# Patient Record
Sex: Male | Born: 1940 | Race: White | Hispanic: No | Smoking: Former smoker
Health system: Southern US, Community
[De-identification: ages and names within clinical notes are randomized; demographics above are authoritative.]

## PROBLEM LIST (undated history)

## (undated) DIAGNOSIS — I82409 Acute embolism and thrombosis of unspecified deep veins of unspecified lower extremity: Secondary | ICD-10-CM

## (undated) DIAGNOSIS — E669 Obesity, unspecified: Secondary | ICD-10-CM

## (undated) DIAGNOSIS — N200 Calculus of kidney: Secondary | ICD-10-CM

## (undated) DIAGNOSIS — E785 Hyperlipidemia, unspecified: Secondary | ICD-10-CM

## (undated) DIAGNOSIS — E559 Vitamin D deficiency, unspecified: Secondary | ICD-10-CM

## (undated) DIAGNOSIS — E1129 Type 2 diabetes mellitus with other diabetic kidney complication: Secondary | ICD-10-CM

## (undated) DIAGNOSIS — I509 Heart failure, unspecified: Secondary | ICD-10-CM

## (undated) DIAGNOSIS — M109 Gout, unspecified: Secondary | ICD-10-CM

## (undated) DIAGNOSIS — I10 Essential (primary) hypertension: Secondary | ICD-10-CM

## (undated) DIAGNOSIS — N400 Enlarged prostate without lower urinary tract symptoms: Secondary | ICD-10-CM

## (undated) HISTORY — DX: Heart failure, unspecified: I50.9

## (undated) HISTORY — DX: Benign prostatic hyperplasia without lower urinary tract symptoms: N40.0

## (undated) HISTORY — DX: Type 2 diabetes mellitus with other diabetic kidney complication: E11.29

## (undated) HISTORY — PX: EYE SURGERY: SHX253

## (undated) HISTORY — DX: Hyperlipidemia, unspecified: E78.5

## (undated) HISTORY — DX: Acute embolism and thrombosis of unspecified deep veins of unspecified lower extremity: I82.409

## (undated) HISTORY — DX: Gout, unspecified: M10.9

## (undated) HISTORY — DX: Calculus of kidney: N20.0

## (undated) HISTORY — DX: Vitamin D deficiency, unspecified: E55.9

## (undated) HISTORY — DX: Obesity, unspecified: E66.9

## (undated) HISTORY — DX: Essential (primary) hypertension: I10

---

## 1997-04-27 HISTORY — PX: CARDIAC CATHETERIZATION: SHX172

## 2001-09-21 ENCOUNTER — Inpatient Hospital Stay (HOSPITAL_COMMUNITY): Admission: RE | Admit: 2001-09-21 | Discharge: 2001-09-22 | Payer: Self-pay | Admitting: Nurse Practitioner

## 2001-09-21 ENCOUNTER — Encounter: Payer: Self-pay | Admitting: Family Medicine

## 2001-09-29 ENCOUNTER — Encounter: Admission: RE | Admit: 2001-09-29 | Discharge: 2001-09-29 | Payer: Self-pay | Admitting: Family Medicine

## 2004-03-01 ENCOUNTER — Emergency Department (HOSPITAL_COMMUNITY): Admission: EM | Admit: 2004-03-01 | Discharge: 2004-03-01 | Payer: Self-pay | Admitting: Emergency Medicine

## 2004-04-11 ENCOUNTER — Ambulatory Visit: Payer: Self-pay | Admitting: Ophthalmology

## 2004-04-15 ENCOUNTER — Ambulatory Visit: Payer: Self-pay | Admitting: Ophthalmology

## 2004-05-27 ENCOUNTER — Ambulatory Visit: Payer: Self-pay | Admitting: Ophthalmology

## 2004-06-03 ENCOUNTER — Ambulatory Visit: Payer: Self-pay | Admitting: Ophthalmology

## 2005-04-27 HISTORY — PX: OTHER SURGICAL HISTORY: SHX169

## 2005-10-06 ENCOUNTER — Ambulatory Visit (HOSPITAL_COMMUNITY): Admission: RE | Admit: 2005-10-06 | Discharge: 2005-10-06 | Payer: Self-pay | Admitting: Orthopaedic Surgery

## 2006-04-27 HISTORY — PX: URETERAL STENT PLACEMENT: SHX822

## 2007-03-12 ENCOUNTER — Inpatient Hospital Stay (HOSPITAL_COMMUNITY): Admission: EM | Admit: 2007-03-12 | Discharge: 2007-03-17 | Payer: Self-pay | Admitting: Emergency Medicine

## 2007-03-12 ENCOUNTER — Ambulatory Visit: Payer: Self-pay | Admitting: *Deleted

## 2007-03-14 ENCOUNTER — Encounter (INDEPENDENT_AMBULATORY_CARE_PROVIDER_SITE_OTHER): Payer: Self-pay | Admitting: Interventional Cardiology

## 2007-03-14 ENCOUNTER — Ambulatory Visit: Payer: Self-pay | Admitting: *Deleted

## 2007-04-04 ENCOUNTER — Ambulatory Visit (HOSPITAL_COMMUNITY): Admission: RE | Admit: 2007-04-04 | Discharge: 2007-04-04 | Payer: Self-pay | Admitting: Urology

## 2007-08-22 ENCOUNTER — Encounter: Payer: Self-pay | Admitting: Emergency Medicine

## 2007-08-22 ENCOUNTER — Ambulatory Visit: Payer: Self-pay | Admitting: *Deleted

## 2007-08-23 ENCOUNTER — Inpatient Hospital Stay (HOSPITAL_COMMUNITY): Admission: EM | Admit: 2007-08-23 | Discharge: 2007-08-26 | Payer: Self-pay | Admitting: *Deleted

## 2007-08-23 ENCOUNTER — Encounter (INDEPENDENT_AMBULATORY_CARE_PROVIDER_SITE_OTHER): Payer: Self-pay | Admitting: *Deleted

## 2007-12-08 ENCOUNTER — Emergency Department (HOSPITAL_COMMUNITY): Admission: EM | Admit: 2007-12-08 | Discharge: 2007-12-08 | Payer: Self-pay | Admitting: Emergency Medicine

## 2007-12-28 ENCOUNTER — Inpatient Hospital Stay (HOSPITAL_COMMUNITY): Admission: EM | Admit: 2007-12-28 | Discharge: 2007-12-29 | Payer: Self-pay | Admitting: Emergency Medicine

## 2007-12-29 ENCOUNTER — Ambulatory Visit: Payer: Self-pay | Admitting: Vascular Surgery

## 2007-12-29 ENCOUNTER — Encounter (INDEPENDENT_AMBULATORY_CARE_PROVIDER_SITE_OTHER): Payer: Self-pay | Admitting: Internal Medicine
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
## 2008-10-23 IMAGING — US US RENAL PORT
1 series · 14 of 25 positions shown · non-contrast
Comparison: No priors for comparison.

CLINICAL DATA: Elevated creatinine and myocardial infarction.  
 PORTABLE RENAL/URINARY TRACT ULTRASOUND:
TECHNIQUE: Complete ultrasound examination of the urinary tract was performed including evaluation of the kidneys, renal collecting systems, and urinary bladder.

[Series 1: unknown · 0.39mm/px · 14 of 32 slices shown]
[im 1/32]
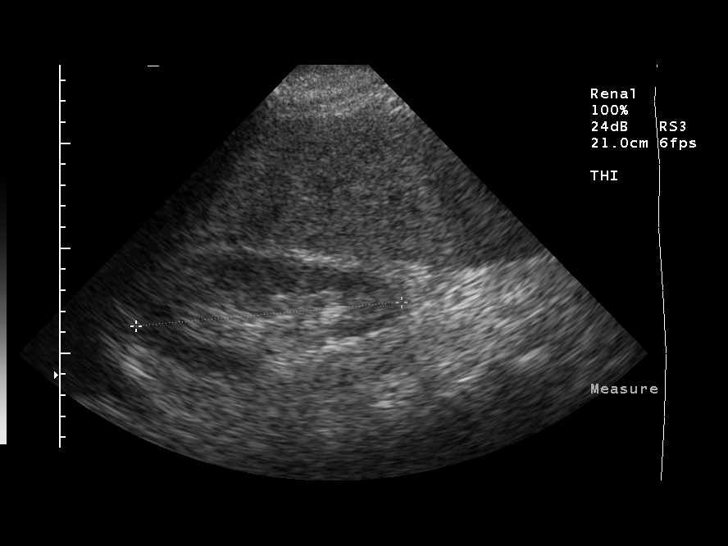
[im 3/32]
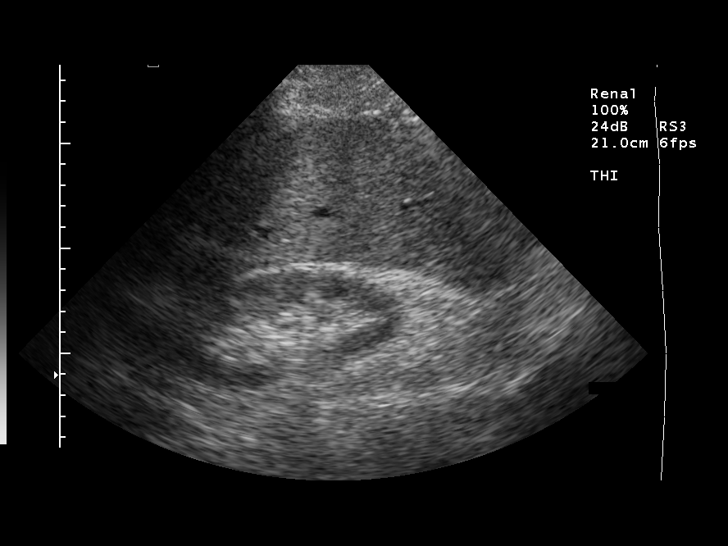
[im 6/32]
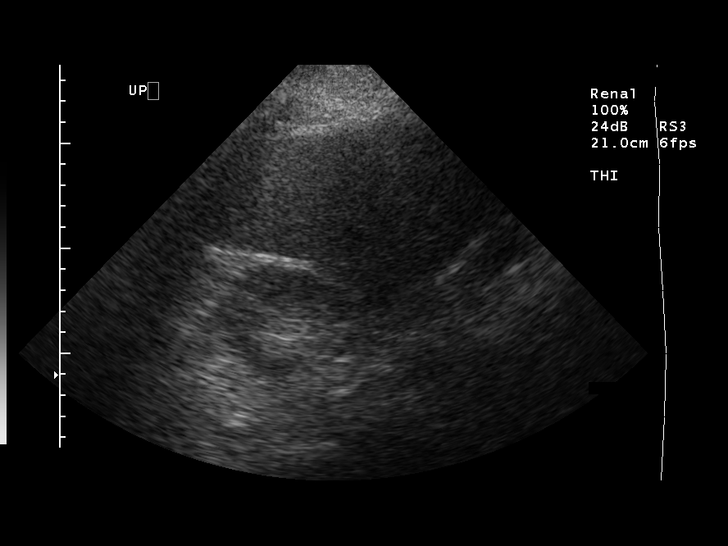
[im 8/32]
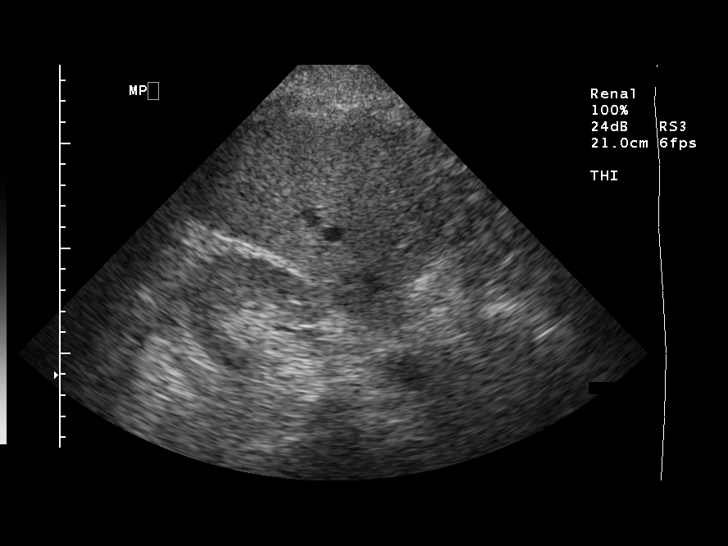
[im 11/32]
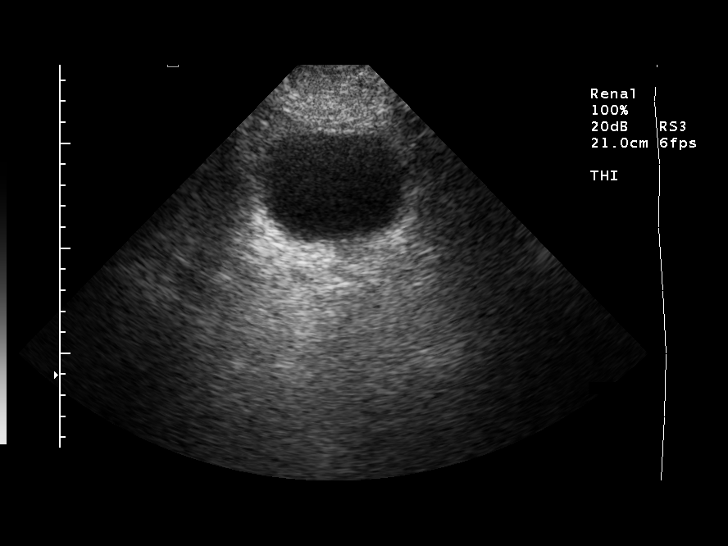
[im 12/32]
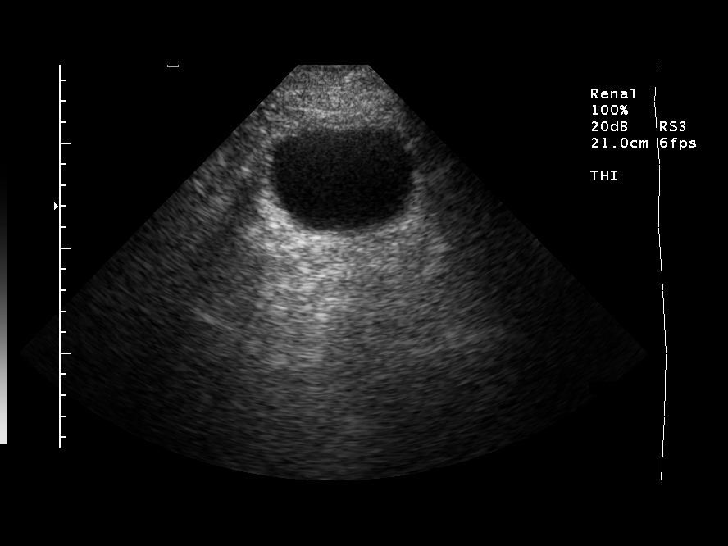
[im 15/32]
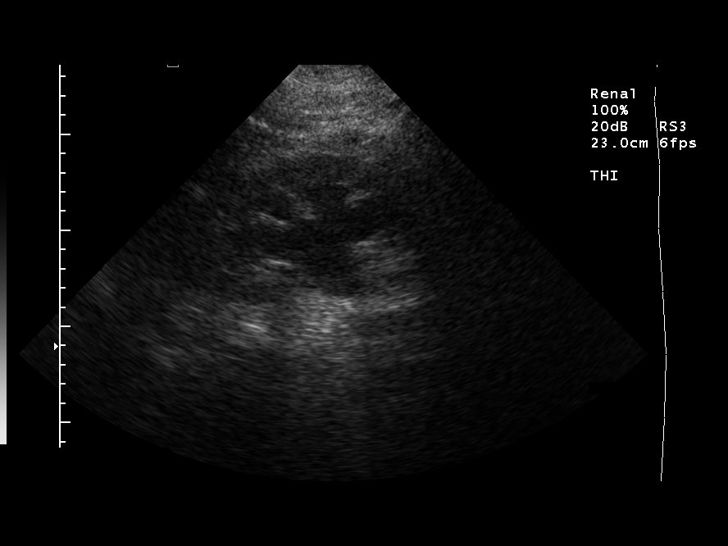
[im 17/32]
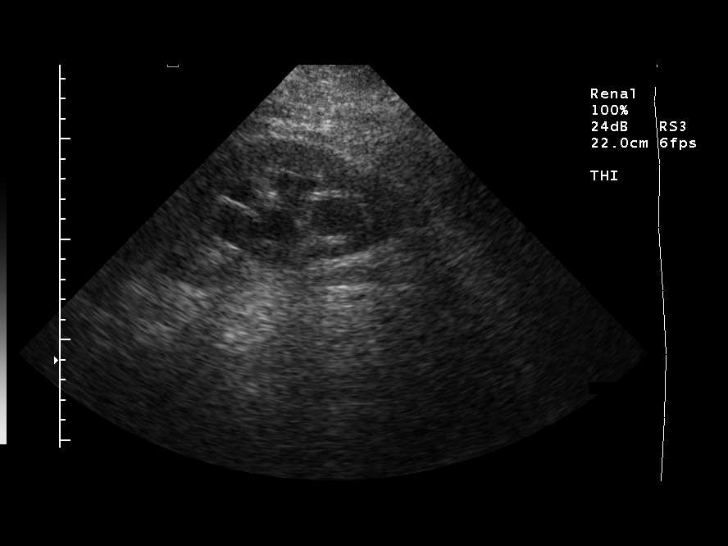
[im 20/32]
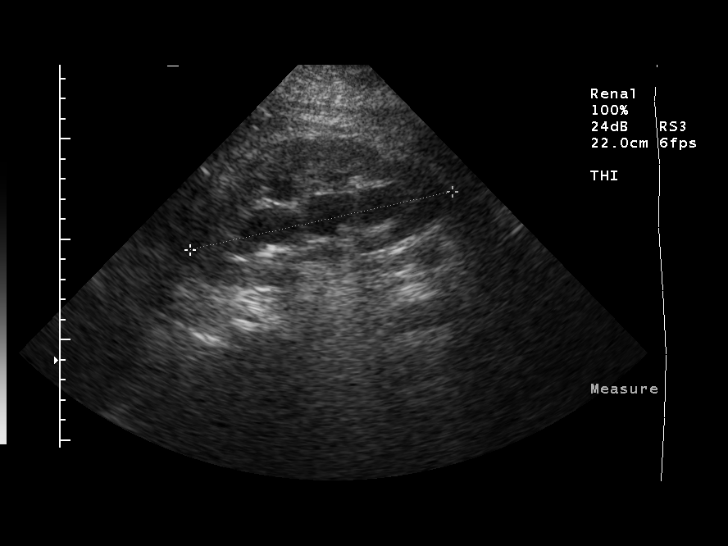
[im 21/32]
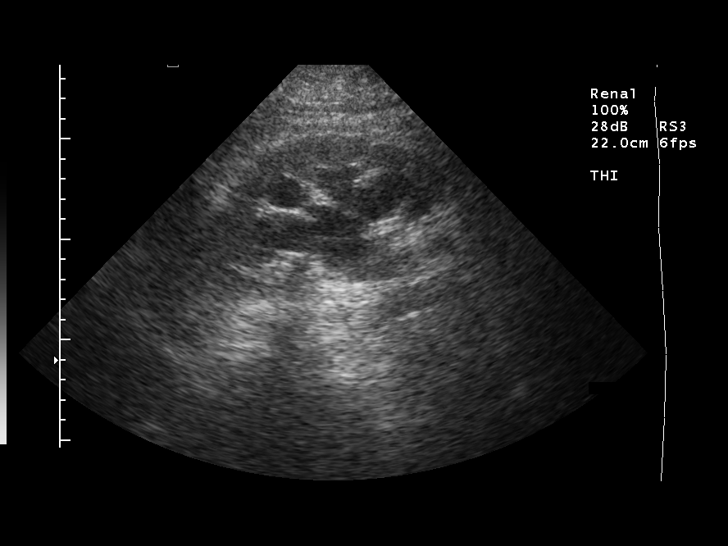
[im 24/32]
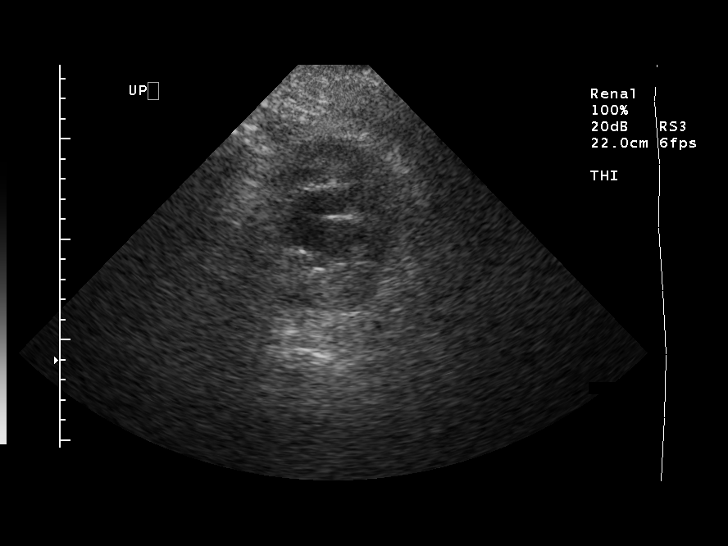
[im 26/32]
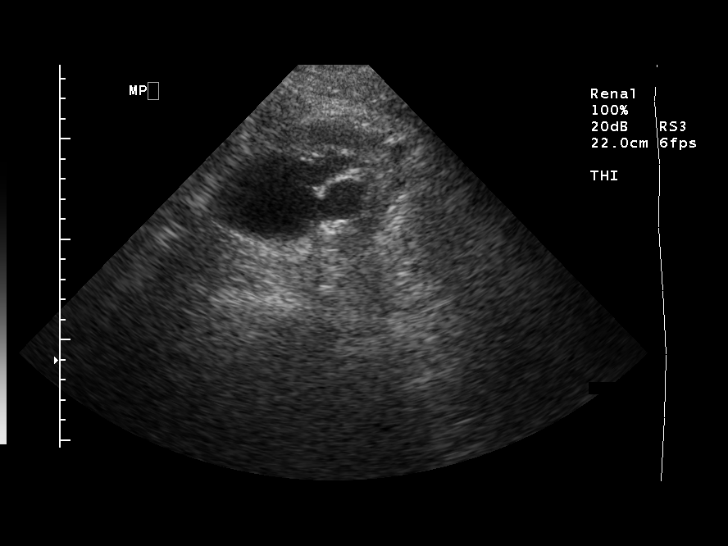
[im 29/32]
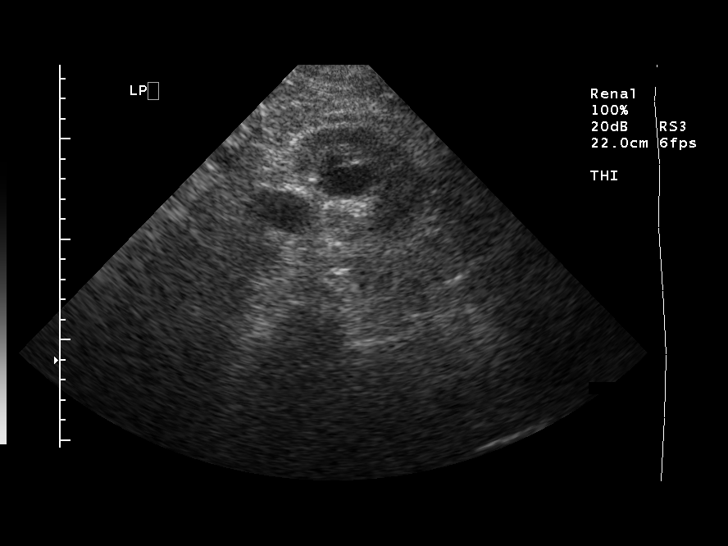
[im 32/32]
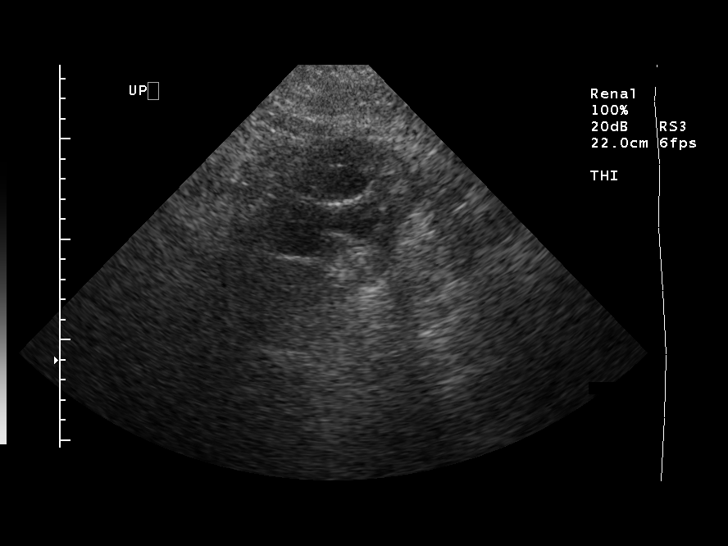

[14 of 25 positions shown; findings below may reference images not displayed]

FINDINGS: The right kidney is 12.7 cm in length and the left kidney is 13.4 cm in length.  There is moderate hydronephrosis of the left kidney.  The visualized proximal portion of the left ureter appears dilated; however, only a small segment of the ureter can be visualized sonographically. 
 The renal cortex of the left kidney appears preserved. 
 There is no hydronephrosis on the right.
 Imaging of the bladder shows it to be moderately distended with urine.
IMPRESSION: Moderate left      hydronephrosis.  A discrete cause      for the hydronephrosis is not identified sonographically. 
  Negative for hydronephrosis      on the right.  

 This study was made a call report.

## 2008-11-15 IMAGING — CR DG ABDOMEN 1V
2 series · 2 of 2 positions shown · non-contrast
Comparison: No prior plain films.  This exam is correlated with a CT scan dated 03/15/07.

CLINICAL DATA: Pre-ESWL.
 ABDOMEN ? 1 VIEW:

[t abdomen supine (1 of 2)]
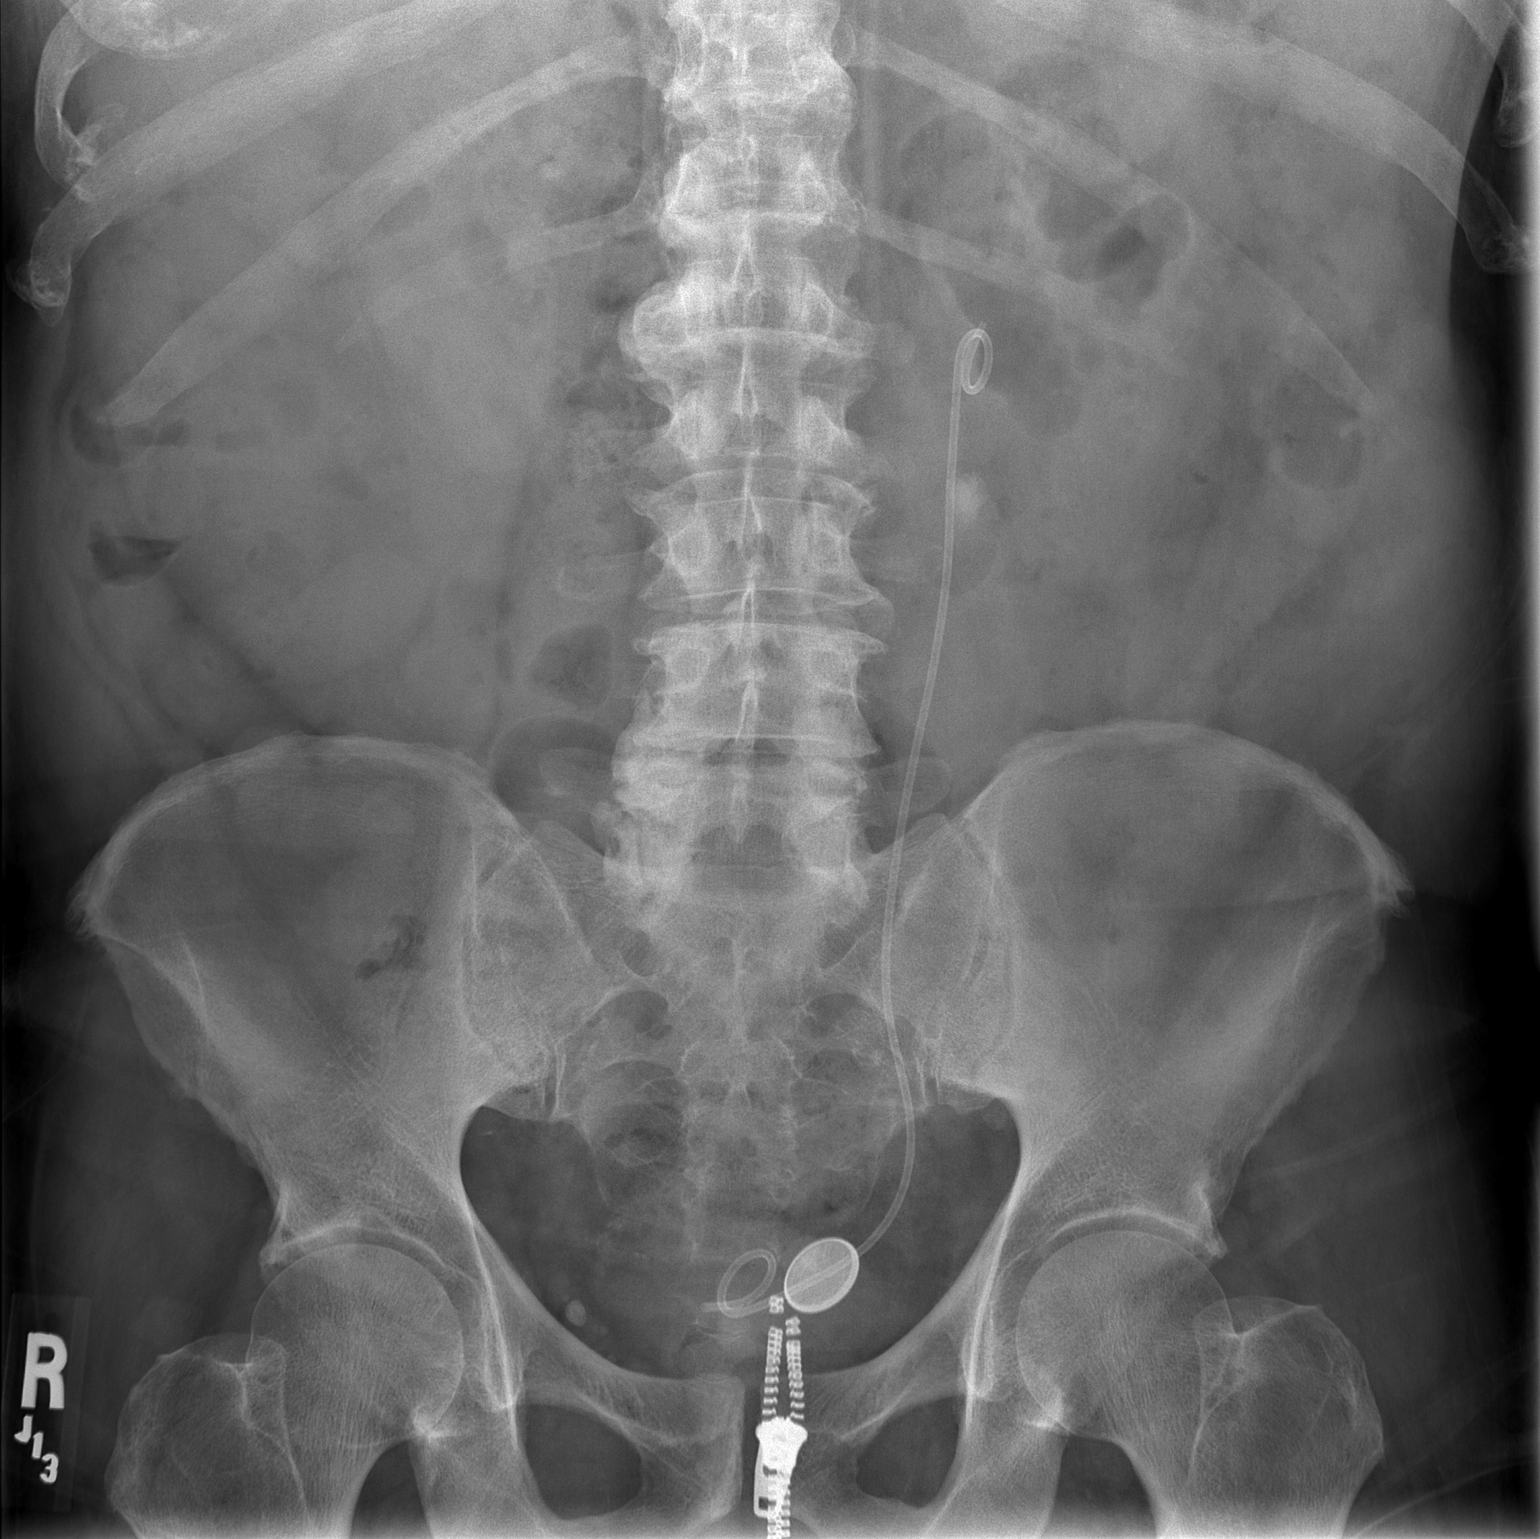

[t abdomen supine (2 of 2)]
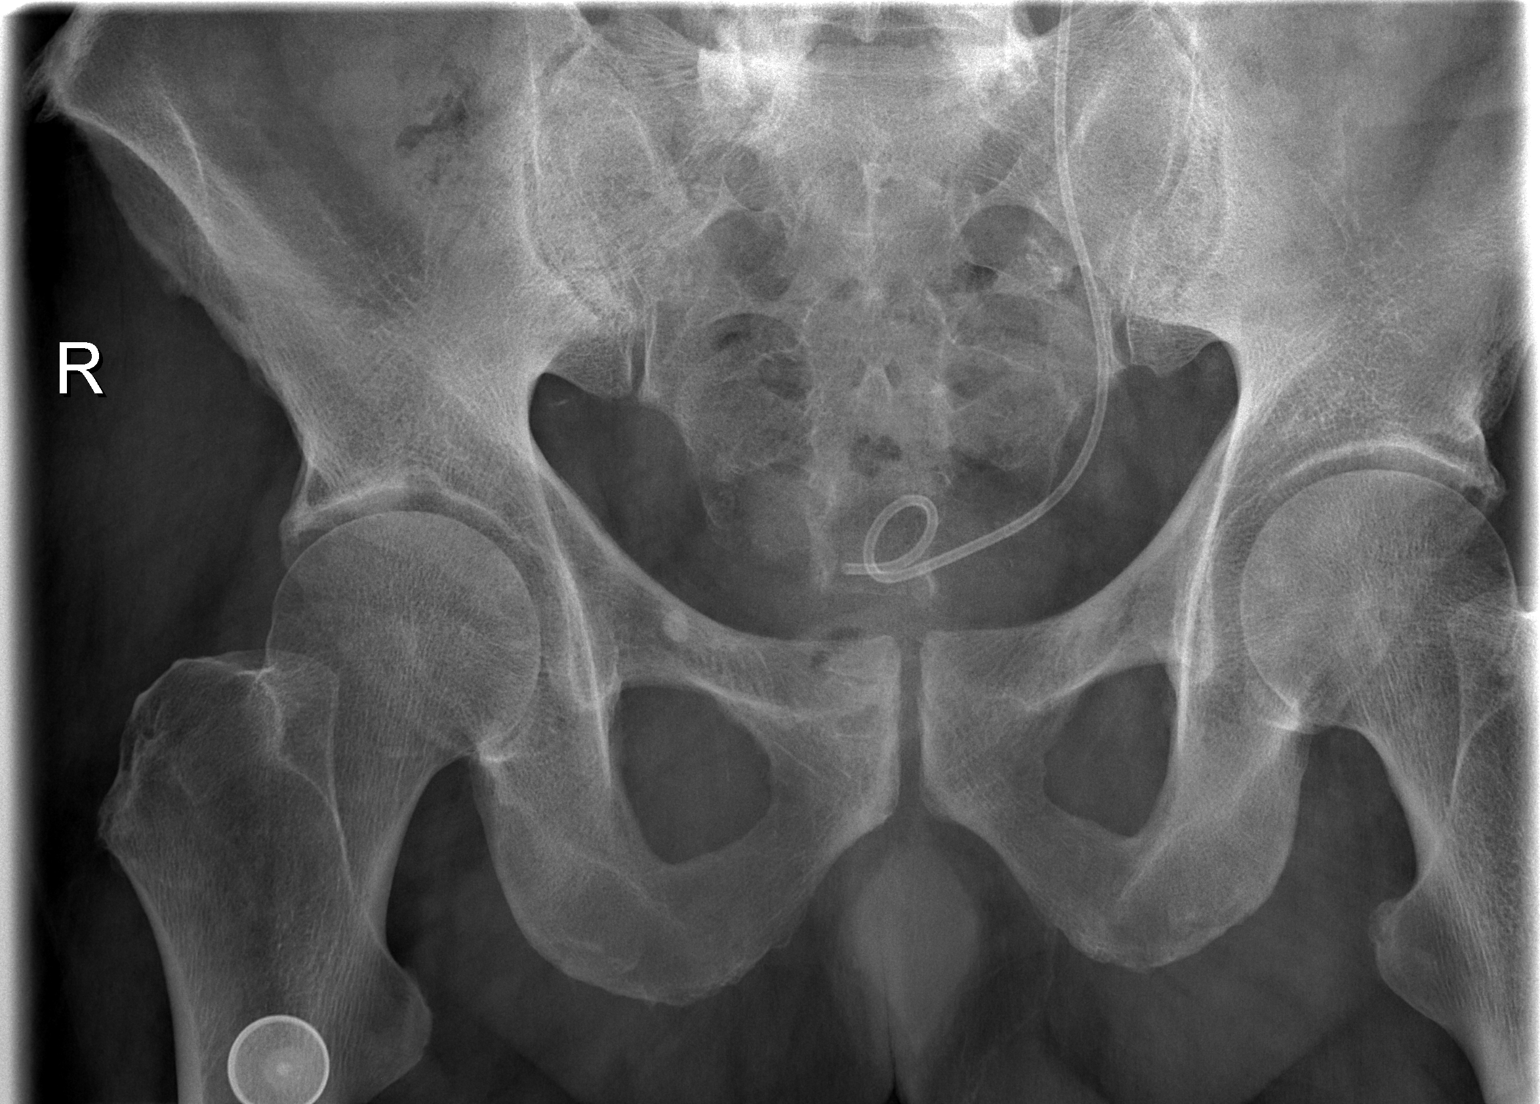

[2 of 2 positions shown; findings below may reference images not displayed]

FINDINGS: A Double-J ureteral catheter has been placed on the left.  The large ureteral stone, which was at the level of L4 on the CT scan scout view, has now been pushed upward into the ureter at the level of about L2-3.  No definite renal calculi.
IMPRESSION: 1.  A Double-J ureteral catheter has been placed on the left.
 2.  The large left ureteral stone is a bit more proximal in position when compared to the CT scan dated 03/15/07.

## 2009-08-10 IMAGING — CT CT CERVICAL SPINE W/O CM
3 of 4 series · 16 of 33 positions shown, 19 images · non-contrast
Comparison: Brain MRI 08/24/2007.

CT HEAD

CLINICAL DATA: THE PATIENT STATUS POST FALL.

CT HEAD WITHOUT CONTRAST
CT CERVICAL SPINE WITHOUT CONTRAST
TECHNIQUE: Multidetector CT imaging of the head and cervical spine
was performed following the standard protocol without intravenous
contrast.  Multiplanar CT image reconstructions of the cervical
spine were also generated.

[Series 6: c_spine 2.0 b31s detail · axial · 0.24mm/px · z∈[+309,+441]mm · 8 of 86 slices shown, 10 images]
[im 10/86  soft-tissue]
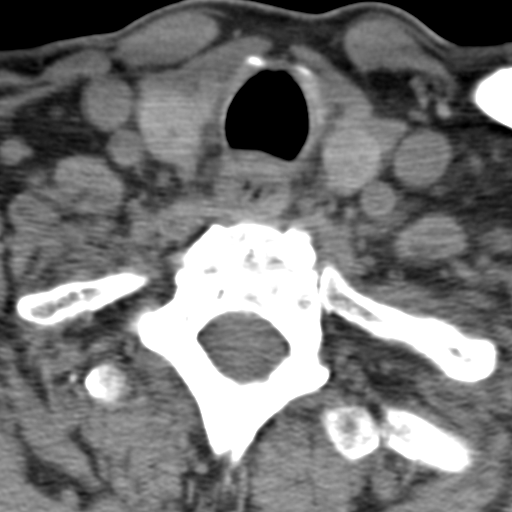
[im 10/86  bone]
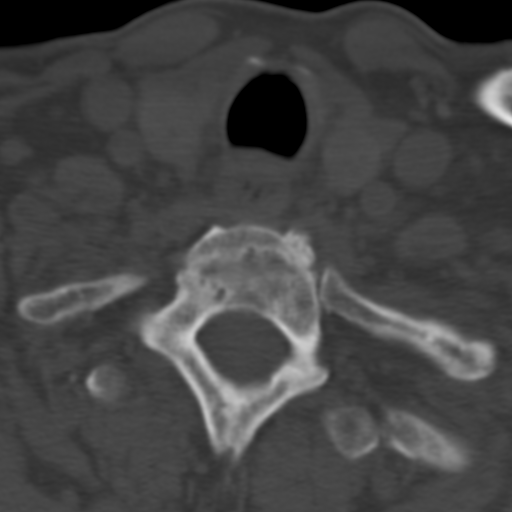
[im 19/86  bone]
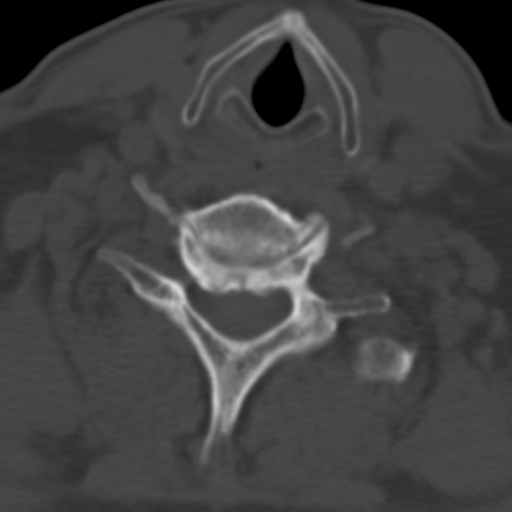
[im 29/86  bone]
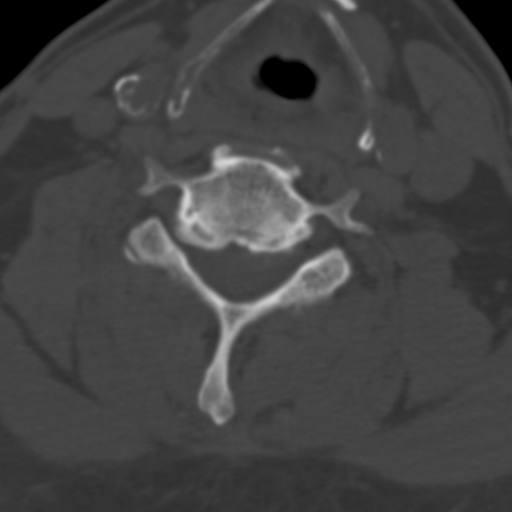
[im 38/86  bone]
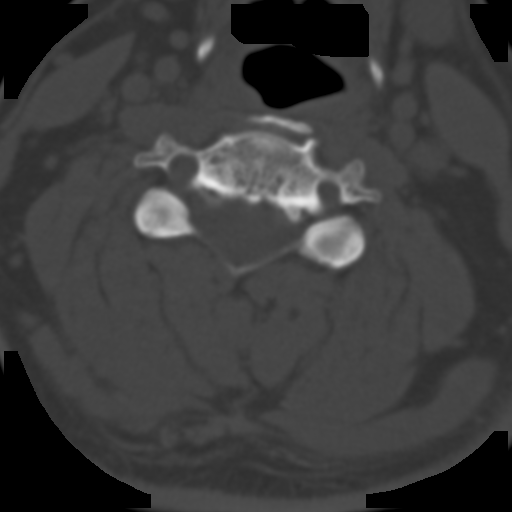
[im 48/86  soft-tissue]
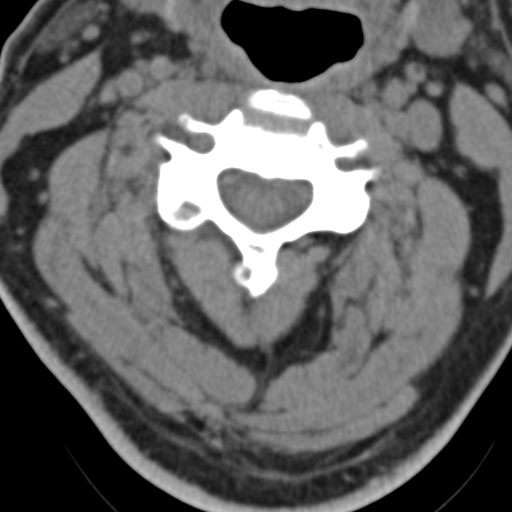
[im 48/86  bone]
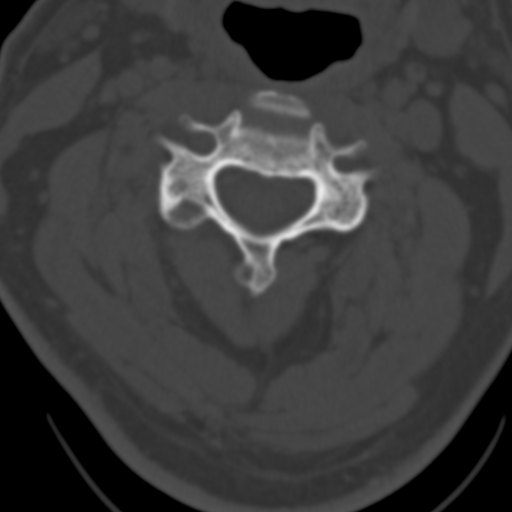
[im 57/86  bone]
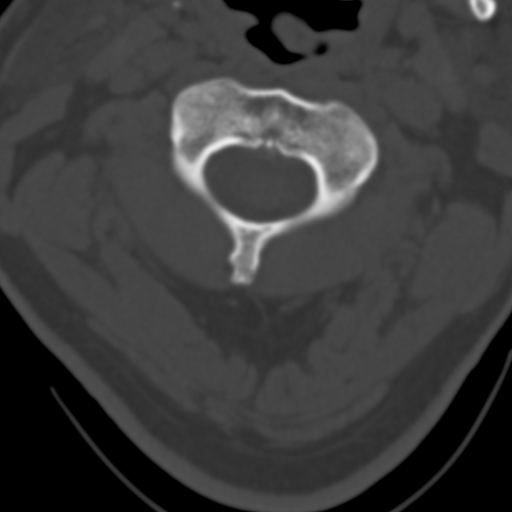
[im 67/86  bone]
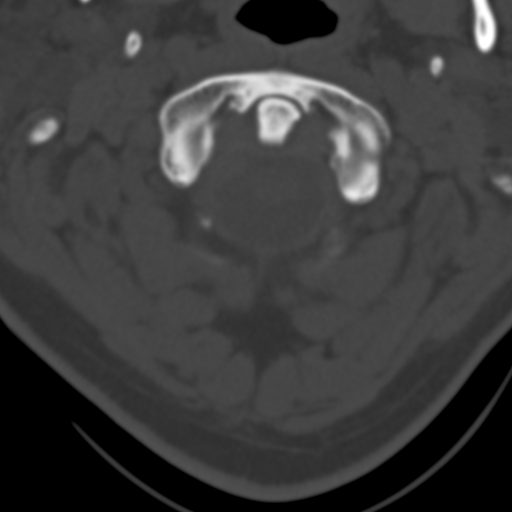
[im 76/86  bone]
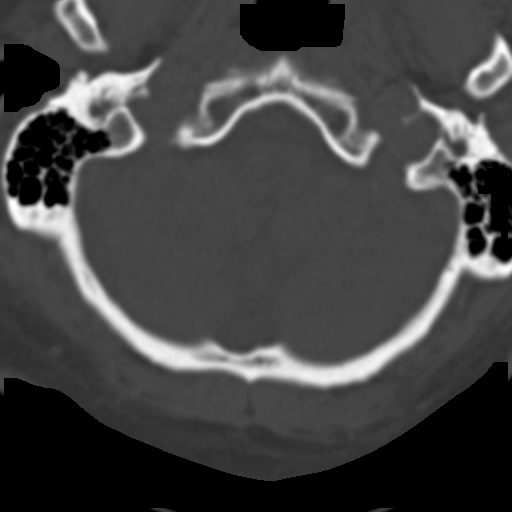

[Series 11: c_spine 2.0 spo cor detail · coronal · 0.43mm/px · 3 of 46 slices shown]
[im 10/46  bone]
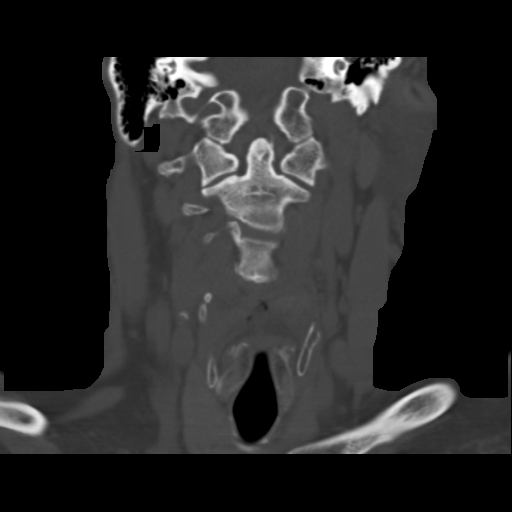
[im 19/46  bone]
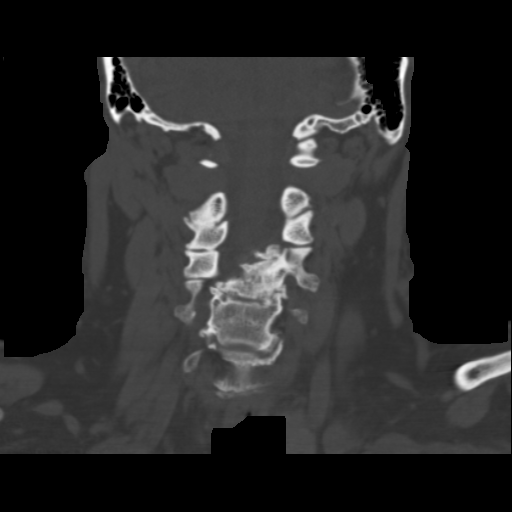
[im 28/46  bone]
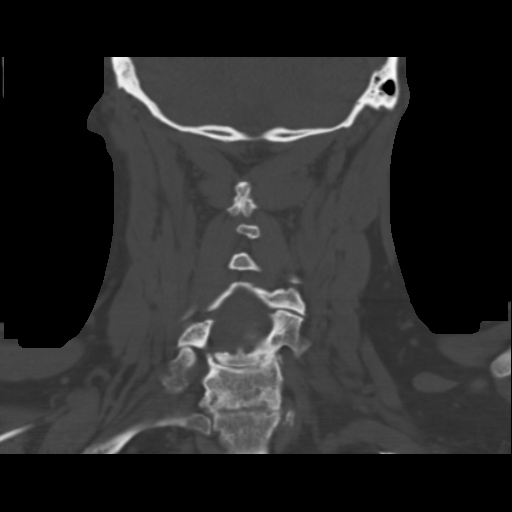

[Series 12: c_spine 2.0 spo sag detail · sagittal · 0.43mm/px · 5 of 51 slices shown, 6 images]
[im 17/51  bone]
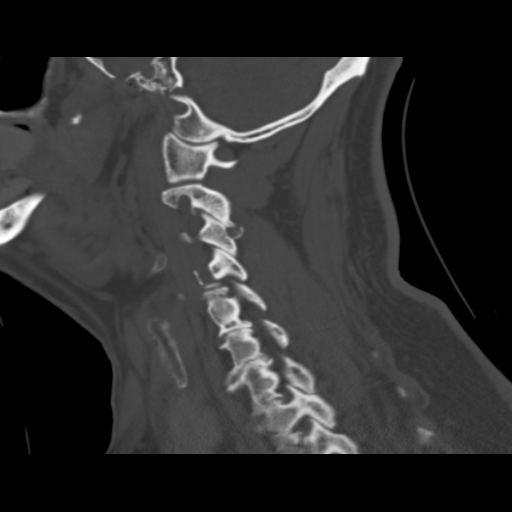
[im 21/51  bone]
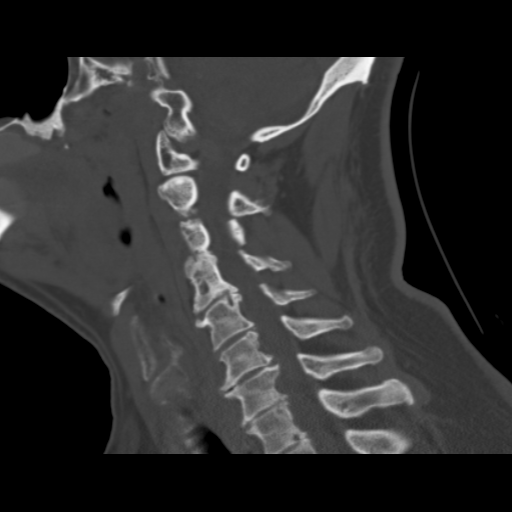
[im 26/51  soft-tissue]
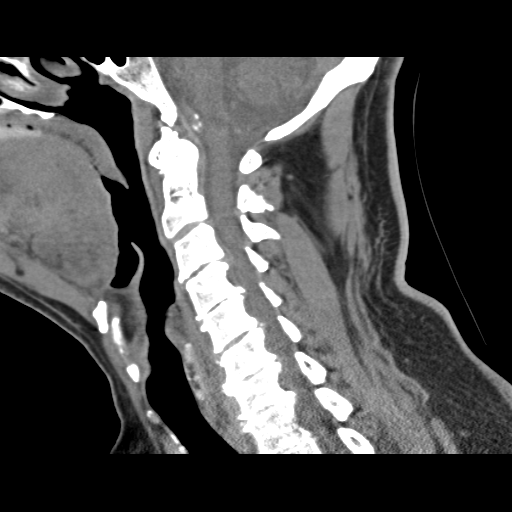
[im 26/51  bone]
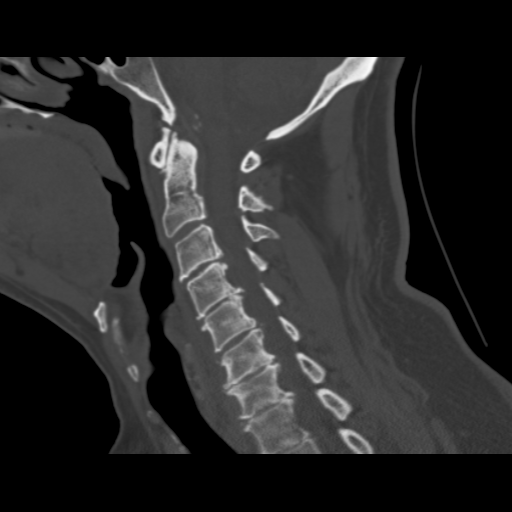
[im 30/51  bone]
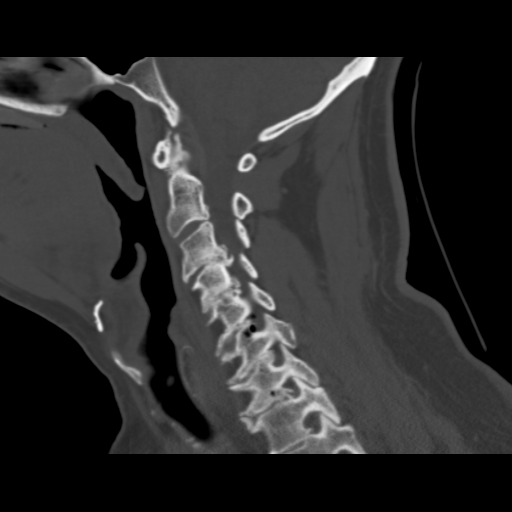
[im 34/51  bone]
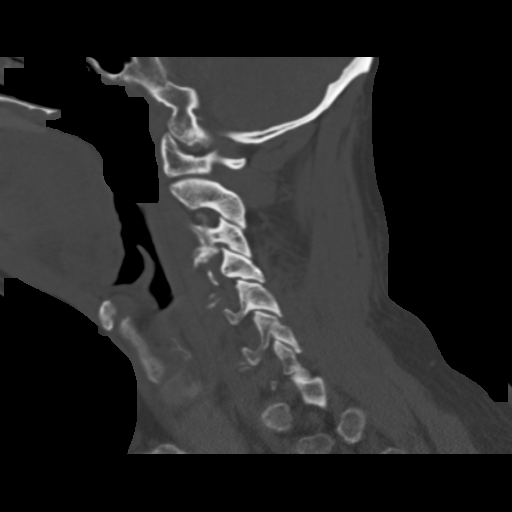

[16 of 33 positions shown; findings below may reference images not displayed]

FINDINGS: There is soft tissue contusion over the right supra
orbital rim.  Orbit as visualized on this head CT appears normal.
No underlying fracture is identified.  The brain appears normal
without evidence of hemorrhage, infarct, mass, mass effect, midline
shift or abnormal extra-axial fluid collection.  No hydrocephalus.
IMPRESSION: No acute intracranial abnormality in patient with a soft tissue
contusion over the right supra orbital rim.

CT CERVICAL SPINE
FINDINGS: Vertebral body height and alignment maintained.
Prevertebral soft tissues appear normal.  No epidural hematoma is
identified.  The patient has multilevel degenerative disease of the
cervical spine with endplate spurring at all levels and loss of
disc space height at multiple levels noted.  Lung apices are clear.
IMPRESSION: 1.  No acute finding
2.  Degenerative disease.

## 2009-12-12 ENCOUNTER — Ambulatory Visit (HOSPITAL_COMMUNITY): Admission: RE | Admit: 2009-12-12 | Discharge: 2009-12-12 | Payer: Self-pay | Admitting: Internal Medicine

## 2010-09-09 NOTE — Consult Note (Signed)
NAMEKHARI, [name]                ACCOUNT NO.:  [id number]   MEDICAL RECORD NO.:  [id number]          PATIENT TYPE:  INP   LOCATION:  3703                         FACILITY:  [facility]   PHYSICIAN:  Maree Krabbe, M.D.DATE OF BIRTH:  [birth year]   DATE OF CONSULTATION:  03/12/2007  DATE OF DISCHARGE:                                 CONSULTATION   REASON FOR CONSULTATION:  Elevated creatinine.   HISTORY OF PRESENT ILLNESS:  The patient is a 70 year old white male  with a history of DVT, hypertension and diabetes.  He has had  hypertension and diabetes for about 15 years.  He has had chronic ankle  edema in the left leg since his DVT in 2003.  He has also had some lower  extremity swelling for the last couple of years, but no history of heart  failure or pulmonary edema in the past.  He also has no history of renal  insufficiency.  The patient is smoker.  He denies any history of  diabetic retinopathy.  He has history of kidney stones, osteoarthritis  and lipoma removal.   The patient was admitted earlier today with a 2-week history of  worsening lower extremity and progressive dyspnea on exertion.  He had  some associated chest heaviness.  He says he has had orthopnea for  years.  He was found to have pulmonary edema.  Blood pressure was high  in the emergency room at 210/106 initially.  Further cardiac studies are  pending.  Cardiac enzymes were negative.  He has been treated with IV  Lasix and was started on captopril initially, but that has been stopped  given his elevated creatinine.  He is on IV heparin and has had no  further chest pain.  He is breathing is better currently with a  reasonable urine output.   PAST MEDICAL HISTORY:  Past medical history as above.   MEDICATIONS:  On admission, he was taking Lantus, aspirin and one other  medication, which I cannot make out on the H&P.   CURRENT MEDICATIONS:  Aspirin, Lipitor, Colace, IV Lasix, IV heparin,  insulin, Bidil one  t.i.d., Lopressor p.r.n.   SOCIAL HISTORY:  A 70 year old, smoker, widowed 4 years ago.  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   FAMILY HISTORY:  Positive for CHF, diabetes, Parkinson's in his father  and his mother has heart disease.  No renal failure.   REVIEW OF SYSTEMS:  GENERAL:  Denies fever, chills, sweats or weight  loss.  ENT:  No hearing loss or visual change.  No difficulty  swallowing.  CARDIORESPIRATORY:  As above.  GI:  No nausea, vomiting,  abdominal pain or diarrhea.  GU:  No difficulty voiding.  MUSCULOSKELETAL:  No myalgia or arthralgia, otherwise as above.  NEUROLOGIC:  No focal numbness or weakness.  No history of stroke, TIA  or seizure.   PHYSICAL EXAMINATION:  VITAL SIGNS:  Temperature 98.3, blood pressure  160/85, heart rate is 80, respirations are 16 and unlabored.  GENERAL:  This is an alert, pleasant adult male in no acute distress.  SKIN:  Without rash.  HEENT:  PERRL, EOMI.  Throat is clear.  NECK:  Supple with nondistended neck veins.  CHEST:  Faint rales at the left base.  The right is clear.  CARDIAC:  Regular rate and rhythm without murmur, rub or gallop.  ABDOMEN:  Soft, nontender, no masses or bruits.  GENITALIA:  Deferred.  EXTREMITIES:  He has 3+ pitting edema from the knees to the ankles  bilaterally. He has skin discoloration in both lower legs consistent  with chronic venous insufficiency.  It is worse on left than on the  right.  He has palpable dorsalis pedis pulse bilaterally which is 1+.  He has no femoral bruits.  No carotid bruits.  NEUROLOGIC:  Alert and oriented x3, nonfocal motor exam.   LABORATORY DATA AND X-RAY FINDINGS:  Hemoglobin A1c was 5.5.  CBC with  hemoglobin 11, white count, normal platelets normal.  Cholesterol was  239, LDL fraction was 168.  BUN was 23, creatinine 2.14.  Estimated GFR  was 31.  Potassium 3.5, bicarb 29.  BMP was elevated at 1061.  Urinalysis with no rbc's wbc's.  He had a 100 protein.   Chest  x-ray with bilateral CHF and small effusions.   IMPRESSION/RECOMMENDATIONS:  1. Chronic kidney disease, stage III-IV, uncertain cause.  He may have      hypertensive nephrosclerosis, diabetic nephropathy or some other      renal disease.  Need to rule out obstruction with a renal      ultrasound and also screen for multiple myeloma with a 24-urine and      serum protein electrophoresis.  Last creatinine was 1.1 in 2005.      We have no other recent data here in the hospital.  Agree with the      current medical treatment including holding angiotensin-converting      enzyme inhibitors, diuresis and blood pressure control.  His blood      sugar control appears to have been very good with a low hemoglobin      A1c.  2. Volume excess.  3. New diagnosis of congestive heart failure with associated pulmonary      edema workup in progress.  4. Chronic venous insufficiency of both lower extremities.  5. History of left lower extremity deep venous thrombosis in 2003.  6. Acute coronary syndrome.  7. Hypertensive urgency.  8. Diabetes mellitus type 2, non-insulin-dependent with 15 years      duration.  9. Tobacco use.      Maree Krabbe, M.D.  Electronically Signed     RDS/MEDQ  D:  03/12/2007  T:  03/14/2007  Job:  [id number]

## 2010-09-09 NOTE — Procedures (Signed)
EEG NUMBER:  [id number].   REQUESTING PHYSICIAN:  Vania Rea, MD   CLINICAL HISTORY:  This is a portable EEG done at bedside.  The patient  is described as awake and asleep.  A 70 year old man with syncopal  episodes.  EEG is done for evaluation of possible seizure.   DESCRIPTION:  The dominant rhythm in this tracing is a moderate  amplitude alpha rhythm of 9-10 Hz, which predominates posteriorly,  appears without abnormal asymmetry, and attenuates with eye opening and  closing.  Low-amplitude fast activity is seen frontally and centrally  and appears without abnormal asymmetry.  Later in the recording,  drowsiness occurs as evidenced by fragmentation of the background and  generalized attenuation of rhythms.  Brief periods of stage II sleep is  achieved as evidenced by the appearance of some sleep spindles and K  complexes.  No abnormalities are seen in drowsiness or sleep.  Photic  stimulation produced weak driving responses.  Hyperventilation was not  performed.  Single channel devoted EKG revealed significant sinus  bradycardia throughout with a rate of approximately 42 beats per minute.   CONCLUSION:  Normal study in the awake, drowsy, and sleep states.  EKG  tracing demonstrates a severe sinus bradycardia, a finding of likely  significance in this patient with a history of syncope.      Michael L. Thad Ranger, M.D.  Electronically Signed     [id number]  D:  12/29/2007 16:37:16  T:  12/30/2007 06:38:08  Job #:  [id number]

## 2010-09-09 NOTE — Procedures (Signed)
[name] is 70 years of age.   EEG NUMBER:  [id number].   [name] is a patient at [facility] seen in a neurology  consultation.  He appeared confused, having occasional staring spells,  and has memory lapses.  The question of absence seizures was raised.  The patient has a history of diabetes, hypertension, and takes the  following medications: Coreg, Zocor, Catapres, and Lantus.  Activation  procedures included hyperventilation and photic stimulation for this EEG  that was a standard EEG performed at the hospital lab.   A 70 year old admitted for cardiac arrhythmia states he had several  episodes of staring off and appeared disoriented at the time of this  admission, question if he was postictal.   DESCRIPTION:  Posterior dominant rhythm of 8 Hz is noted over both  posterior hemispheres.  The EEG pattern is well formed and symmetric.  There is some frontopolar muscle artifact noticed, which could also be  pulsation artifact.  This does not indicate an abnormality though.  The  patient was able to follow commands.  Amplitude reached 35 microvolts to  45 microvolts.   The heart rate showed sinus bradycardia at about 50-55 beats per minute.  QRS complexes are not distinguishable on this rhythm strip.  The patient  became drowsy and yawned, which caused some movement artifacts.  He did  finally fall asleep and showed symmetric sleep architecture with vertex  sharp waves and sleep spindles produced over the central regions  bilaterally.  She also was snoring loudly during the brief periods of  sleep, but showed no respiratory irregularities.  Hyperventilation did  lead to a significant amount of amplitude buildup and frontopolar muscle  artifact was noted again.  It did not provoke seizure activity or  epileptiform discharges on the EEG.  Photic stimulation showed photic  entrainment at 7, 9, and 11 Hz.  Again, no epileptiform discharges  resulted.   CONCLUSION:  This is an  remarkably normal EEG for the patient's given  history of possible absence seizures.  There is no evidence of absence  seizures here.  Please consider alternate causes for the patient's  confusional spells.  You may also consider a 24-hour portable EEG  recording if the spells have a frequency that makes the capture of a  spell more likely in a 24-hour period.      Melvyn Novas, M.D.  Electronically Signed     NG:EXBM  D:  08/25/2007 08:22:19  T:  08/25/2007 84:13:24  Job #:  [id number]

## 2010-09-09 NOTE — H&P (Signed)
[name], [name] NO.:  [id number]   MEDICAL RECORD NO.:  [id number]          PATIENT TYPE:  EMS   LOCATION:  MAJO                         FACILITY:  [facility]   PHYSICIAN:  Lyn Records, M.D.   DATE OF BIRTH:  1940/07/09   DATE OF ADMISSION:  03/11/2007  DATE OF DISCHARGE:                              HISTORY & PHYSICAL   The patient does not have a primary care physician or a primary  cardiologist.   CHIEF COMPLAINT:  Shortness of breath with increasing lower extremity  edema.   HISTORY OF PRESENT ILLNESS:  This is a 70 year old gentleman with a  history of diabetes mellitus, hypertension and prior DVT who comes in  with 2 weeks worsening lower extremity, the left greater than right.  He  states that he has had chronic leg swelling in the left lower extremity  secondary to a history of a DVT in the remote past; however, he states  that this has been getting worse, as well as has noticed some swelling  in the right leg.  He states that over the past 2 or 3 days he has  noticed increased shortness of breath pretty good with exertion.  He  usually can chop wood without much problem; however, has had significant  problems with dyspnea with just walking 25 feet at home.  He also, over  the past 2 to 3 days, in addition to this dyspnea on exertion, states  that he has had some chest heaviness both at rest and with exertion;  however, he states he has had orthopnea for a number of years and  requires sleeping upright.  He denies any PND, nausea, vomiting or  diaphoresis.  He also denies any radiating pain.  He states his glucose  has ranged anywhere between the 120s to well over 200s when he is not  watching his diet.   Given these symptoms, he was seen in urgent care today and was brought  to the emergency room for further evaluation where he was thought to be  in acute decompensated heart failure and was given a single dose of  Lasix IV.   ALLERGIES:   PENICILLIN.   MEDICATIONS:  1. Lantus 76 units q.a.m.  2. Apidra insulin 5 units subcu q.a.c.  3. Aspirin p.r.n.  4. Had been on Diovan in the past, currently is not taking.   REVIEW OF SYSTEMS:  As above and as in HPI.  The remaining 8-point  review of systems is negative.   PAST MEDICAL HISTORY:  1. History of a deep venous thrombosis in May of 2003.  2. Diabetes since 1994.  3. Hypertension since 1994.  4. Osteoarthritis.  5. History of kidney stones.  6. Status post a lipoma removal.   SOCIAL HISTORY:  He lives in [place] alone.  He is a retired  Management consultant.  He smokes 1 pack to 1-1/2 packs per week.  He  has been smoking for 55 years.  He denies any drug use and seldom uses  alcohol.   FAMILY HISTORY:  His mother is alive  and in good health at the age of  106.  His father died of an MI at 18 years old and had diabetes mellitus.   PHYSICAL EXAMINATION:  VITAL SIGNS:  Temperature of 98.3.  Pulse 77.  Respiratory rate of 18.  Blood pressure 190/112.  O2 saturation 99% on 2  liters of nasal cannula.  IN GENERAL:  He is alert and oriented x3.  In no acute distress.  HEENT EXAM:  Neck is supple.  No lymphadenopathy.  Oral pharynx is  clear.  Sclerae are anicteric.  No carotid bruits.  Normal thyroid.  JVP  is approximately 6 cm with a mild hepatojugular reflux.  LUNGS:  With bibasilar crackles, but good airway movement bilaterally  without any rhonchi or wheezes.  CARDIOVASCULAR EXAM:  Regular rate and rhythm.  Normal S1, S2.  I cannot  hear an S3, nor an S4 gallop.  ABDOMEN:  Truncal obesity.  Positive bowel sounds.  Soft, nontender,  nondistended without palpable liver or spleen.  EXTREMITY EXAM:  He has evidence of bilateral lower extremity swelling  secondary to venous stasis with a left greater than right with chronic  changes of venous stasis on the left leg.  He has 1+ lower extremity  that is mostly symmetrical bilaterally.  NEUROLOGICAL:  Cranial  nerves II-XII are intact.  He has 5/5 motor  strength.  Symmetric in the upper and lower extremities bilaterally.   LABORATORY DATA:  Chest x-ray shows bilateral effusions with bilateral  heart failure.  His heart border is hard to make out given the  underlying edema.  EKG shows normal sinus rhythm at a rate of 84 with  normal axis and normal intervals.  No Q wave and no significant changes  from a prior EKG.  Hematocrit 38, creatinine of 2.0, BUN of 26, glucose  of 92, bicarb of 27, potassium of 3.9.  First set of markers myoglobin  is greater than 500, MB is 15, troponin 0.09, CK of 595.  UA shows  moderate blood and positive protein.   ASSESSMENT:  1. New onset heart failure, most likely secondary to non-ST elevation      myocardial infarction.  2. Diabetes mellitus.  3. Hypertension.   PLAN:  The patient will be admitted to the CCU.  He has been started on  heparin, aspirin, ACE inhibitor and statin for an acute coronary  syndrome.  No beta-blocker has been given, given findings of acute heart  failure on exam.  I have started him on a nitroglycerin drip as well, as  he will need better blood pressure control to help with his heart  failure symptoms.  He has also been placed on subcu insulin.  We will  check an echo in the morning.  We will also check lipids and hemoglobin  A1c, as well as a 24 hour urine for protein.  I have ordered an  ultrasound of his left lower extremity just to ensure that he does not  have recurrence of a deep venous thrombosis and renal ultrasound  given his elevated creatinine to complete that workup.  The patient will  also need smoking cessation prior to discharge.  I will have Dr. Katrinka Blazing  see the patient in the morning and make further decisions regarding the  management of this patient.     ______________________________  Eston Esters. Sherryll Burger, MD      Lyn Records, M.D.  Electronically Signed    BRS/MEDQ  D:  03/11/2007  T:  03/12/2007  Job:   [id number]

## 2010-09-09 NOTE — Procedures (Signed)
EEG NUMBER:  [id number].   REQUESTING PHYSICIAN:  Melvyn Novas, M.D.   CLINICAL HISTORY:  This is a 70 year old man admitted for bigeminy and  episodes of staring off into space.  EEG is performed for evaluation  of possible seizure.   DESCRIPTION:  The dominant rhythm tracing is a moderate amplitude alpha  rhythm of 9-10 Hz, which predominates posteriorly, appears without  abnormal asymmetry and attenuates with eye opening and closing.  Low-  amplitude fast activity is seen frontally and appears without abnormal  asymmetry.  No focal slowing is noted and no epileptiform discharge is  seen.  Drowsiness occurs naturally as evidenced by slowing in  fragmentation of the background and generalized attenuation of rhythms.  No definite stage II sleep is seen on the recording.  Photic stimulation  produced weak driving responses.  Hyperventilation produced no  significant changes in background rhythms.  Single channel devoted to  EKG revealed sinus rhythm throughout with a rate of approximately 60  beats per minute.   CONCLUSIONS:  Normal study in the wake and drowsy state.      Michael L. Thad Ranger, M.D.  Electronically Signed     ZOX:WRUE  D:  08/24/2007 17:11:05  T:  08/25/2007 01:57:08  Job #:  [id number]

## 2010-09-09 NOTE — Consult Note (Signed)
[name], [name]                ACCOUNT NO.:  [id number]   MEDICAL RECORD NO.:  [id number]          PATIENT TYPE:  INP   LOCATION:  2620                         FACILITY:  [facility]   PHYSICIAN:  Deanna Artis. Hickling, M.D.DATE OF BIRTH:  1940-10-27   DATE OF CONSULTATION:  12/29/2007  DATE OF DISCHARGE:                                 CONSULTATION   CHIEF COMPLAINT:  Unresponsive staring spell with a post event  confusional state.   HISTORY OF PRESENT CONDITION:  I was asked by Dr. Orvan Falconer to see [name], 70 year old widowed right-handed gentleman, who had been  admitted to the hospital with two episodes of syncope that it caused  unwitnessed and unanticipated falls in his home.  He suffered  significant ecchymosis and laceration to the eyebrow on the right and  bruised his shoulders.   Last night as he was being admitted, he became agitated, shook off his  leads and said that he was leaving the hospital.  When he was at the  nurses station, he began to became quiet, was unresponsive to verbal  stimuli and looked around as if he did not comprehend the situation.   This gradually improved, by the time Dr. Orvan Falconer saw him a short while  later, however, he remained in a confusional state which was different  from which was witnessed by Dr. Orvan Falconer when he admitted the patient.   Dr. Orvan Falconer contacted me and asked for consultation and also for advice  concerning medication.   The patient has had episodes of confusional states as they were present  than in April 2009.  This was noted by family members.  He lives alone,  but they had observed this on several occasions and he was admitted to  the hospital.  He seen by my partner, Dr. Porfirio Mylar Dohmeier.  The patient  had 2 EEGs, both of which were normal.  Dr. Vickey Huger recommended  Neurontin, but not for any epileptic treatment, but for pain from  peripheral polyneuropathy.   The patient's past medical history is remarkable for  uncontrolled  hypertension and uncontrolled type 2 diabetes mellitus which led to a  stage III/IV chronic kidney disease.  The patient has had recurrent  syncopal episodes which have not been defined.  It is also not clear  that the syncopal episodes are related to periods of unresponsive  staring.   In addition, the patient has ischemic cardiomyopathy, ejection fraction  38% on most recent study in April 2009 ([facility]); obesity; tobacco  abuse, which is recently increased from one-half pack per day to 2 packs  per day in the setting of a broken relationship with a girlfriend;  osteoarthritis; history of nephrolithiasis; history of lower extremity  deep vein thrombosis in 2003; problems with insomnia; dyslipidemia; and  depression.   REVIEW OF SYSTEMS:  The patient is complaining of pain around his right  orbit in his shoulders and says that he wakes up every morning with pain  in his neck and decreased range of motion.  His appetite has been fair,  but he does not sleep well at nighttime.  He has not had significant  intercurrent infections in head, neck, lungs.  However, he likely has a  urinary tract infection at this time.  He has not had any neurologic  conditions including a stroke and has not had any convulsive seizures.  He has not had significant problems with gait disorder weakness,  numbness, tingling, or loss of bowel and bladder control nor has he  bitten his tongue.  A 12-system review is otherwise negative.   PAST MEDICAL HISTORY:  As described above.   PAST SURGICAL HISTORY:  Bilateral iridectomies.   CURRENT MEDICATIONS:  1. Aspirin 81 mg daily.  2. BiDil 1 tablet 3 times daily.  3. Clonidine 0.2 mg twice daily.  4. Coreg 12.5 mg twice daily.  5. Lantus 36 units every morning.  6. Lasix 40 mg daily.  7. Micardis 40 mg daily.  8. Simvastatin 80 mg daily.   DRUG ALLERGIES:  PENICILLIN.   FAMILY HISTORY:  His mother had coronary artery disease.  Father had   diabetes and acute myocardial infarction.  There is no history of  seizures or stroke.   SOCIAL HISTORY:  The patient smokes 2 packs per day.  He has had a  smoking history of 55 years, but says that for most of that time, he  smoked a half to one pack per day.  He drinks alcohol occasionally.  He  denies use of drugs.   PHYSICAL EXAMINATION:  On examination today, temperature 98.2, blood  pressure 192/92, resting pulse 80, respirations 22, oxygen saturation  96%.  Capillary blood glucose 91.  His telemetry shows regular sinus  rhythm.  Ear, nose, and throat, no infections.  He has ecchymosis and  injured eyebrow at the right orbit.  Decreased range of motion of his  neck and tenderness.  No cranial or cervical bruits.  No infection.  No  bite marks in his tongue or cheeks.  Lungs are clear.  Heart, no  murmurs.  Pulses normal.  Abdomen, soft.  Bowel sounds normal.  No  hepatosplenomegaly.  Extremities, normal tender shoulders.  He has pain  with abduction of them.   Neurologic examination:  The patient is awake and alert.  He is tired,  subdued without dysphasia, fully oriented.  He is thinking slowly.  Cranial nerves, round reactive pupils, status post iridectomy.  He has  hypertensive changes in his fundi.  Symmetric facial strength, midline  tongue and uvula, air conduction greater than bone conduction  bilaterally.   Motor examination:  Normal strength, limited only by pain and his fine  motor movements are normal.  I cannot test drift because he has pain on  elevating his arms.   Sensation shows peripheral polyneuropathy in a stocking distribution.  He has decreased vibration to ankles.  He has intact proprioception,  stereognosis.  Cerebellar examination:  Good finger-to-nose, heel-knee-  shin, rapid alternating movements.  Gait was not tested.  Deep tendon  reflexes are absent.  He had bilateral flexor plantar responses.   IMPRESSION:  Transient alteration of  awareness(780.02) evaluate for  seizures.   PLAN:  On EEG.  I realized this is the third study, he has had this  year.  It would be helpful to consider a prolonged EEG, however, given  the infrequency of the episodes (he lives alone, so we do not know how  often they occur).  The yield of a prolonged ambulatory or an inpatient  video EEG is still somewhat low.  We are reluctant to  place him on  another medication when we do not have clear diagnosis, although the  observation certainly is very suspicious for complex partial seizures.   It would not be unreasonable to start him on carbamazepine 200 mg twice  daily and increase after 4 days to 300 mg twice daily.  The use of  Keppra or levetiracetam in the patient with renal failure, though  possible, it is difficult because the levels that we can obtain not only  unhelpful for titration, but also for toxicity.  Keppra is almost  completely eliminated by the kidneys.   Carbamazepine given, the other medicines that he takes is probably the  least expensive and most effective for this type of seizure if indeed he  had seizures.  The other aspect of concern is his syncope.  With his  ischemic cardiomyopathy, I am suspicious that he may have some cardiac  arrhythmia.  It might be worthwhile to have implanted cardiac monitor  for a month to see if any insights can be obtained in this gentleman.   He earnestly desires to go home at this time.  I have persuaded him that  we need to carry out studies and try to figure out why he had a syncopal  spells and his unresponsive staring spell.  Since he does not remember  them, he is not as concerned about them as his physicians.  I am pleased  that he has a primary care physician, who can oversee this and copy will  be sent to Dr. Milinda Cave.  I will be happy to see him in followup based on  his clinical situation.  If you have questions, I can be of assistance,  do not hesitate to contact me.       Deanna Artis. Sharene Skeans, M.D.  Electronically Signed     [facility]/MEDQ  D:  12/29/2007  T:  12/29/2007  Job:  [id number]   cc:   Vania Rea, M.D.  Jeoffrey Massed, MD  Dyke Maes, M.D.

## 2010-09-09 NOTE — Consult Note (Signed)
[name], [name]                ACCOUNT NO.:  [id number]   MEDICAL RECORD NO.:  [id number]          PATIENT TYPE:  INP   LOCATION:  3712                         FACILITY:  [facility]   PHYSICIAN:  Melvyn Novas, M.D.  DATE OF BIRTH:  Nov 06, 1940   DATE OF CONSULTATION:  08/23/2007  DATE OF DISCHARGE:                                 CONSULTATION   CONSULT REQUESTED BY:  [facility].   HISTORY:  This is a 70 year old right-handed Caucasian male who is today  admitted to the [facility] for episodic confusion that he  states has been reported to look like day dreaming.  He also states his  girlfriend came over with 2 dogs to visit him a couple of days ago and  the same evening when she asked him about it, he could not remember that  she had been there.  Family members, especially his 3 children have also  stated that he sometimes stares off and seems to be just mentally  absent.  Over the last 10 or 11 days, there has been a higher degree of  confusion that the patient calls disorientation.   REVIEW OF SYSTEMS:  Staring attacks, disorientation, shortness of  breath, feeling cold, but no fever, no pain, no seizures, and no falls  are reported.   PAST MEDICAL HISTORY:  1. Positive for CHF.  2. Chronic renal insufficiency.  3. Hypertension.  4. Diabetes mellitus, insulin dependent.   The patient was hypotensive at the time of his admission, but now has  recovered his blood pressure.  He did have lower extremity edema,  received Lasix and this has meanwhile cleared.   SOCIAL HISTORY:  The patient lives alone, but his girlfriend lives next  door.  He has smoked 1-1/2 packs per day for over 30 years.  He is a  social alcohol drinker.  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   FAMILY HISTORY:  He has a family history of heart disease.  His father  died of an MI, but was also diabetic.  There is no health history for  his siblings.  He  states that he has good contact with one of his  brothers who is healthy.  He has 3 living children by his first wife,  the youngest daughter has back problems.  He is not aware of any other  medical problems in his children.   MEDICATIONS:  1. Aspirin.  2. BiDil.  3. Lisinopril which was recently doubled from 20 to 40 mg that he      states.  [facility].  4. Lasix.  5. Lantus insulin.   He has been at time noncompliant with medication and states that this  has economic reasons.  He sees Dr. Katrinka Blazing, [facility].   PHYSICAL EXAMINATION:  VITAL SIGNS:  The patient has a temperature of  98.2 degrees Fahrenheit, blood pressure now of 140/80, and 98 saturation  on room air.  Capillary blood glucose level fastened was 179 this  morning.  LUNGS:  Clear to auscultation.  COR:  Regular rate and rhythm.  No carotid bruit.  No cardiac murmur.  NEUROLOGIC:  The patient has a fluent speech.  No dysarthria.  No  apraxia.  Alert and oriented x3.  Cranial nerve examination shows pupils  reacting to light, but sluggishly so.  He is status post bilateral  cataract surgery, has full extraocular movements with conjugate gaze.  No facial asymmetry.  Normal visual fields to finger perimetry. Tongue  and uvula are midline.  Motor examination shows a weak grip bilaterally.  Good dorsiflexion and plantar flexion bilaterally.  Normal finger-to-  nose without tremor, ataxia, or dysmetria.  Reflexes were at level 1and  appear not extremely attenuated.  Patellar reflex was easily elicitable.  Achilles tendon reflex was attenuated.  Downgoing toes to plantar  stimulation noted.  SENSORY:  The patient has bilateral hand numbness which could fit a  carpal tunnel syndrome.  He has pin and needle dysesthesias in the  fingertips.  His feet feel often cold and he does have delayed capillary  refill.  He is very ticklish and he describes that sometimes his legs  seem to  fall asleep as do his hands.  Gait, he has recently been made  aware that he sometimes runs into objects.  He does not state that he  drifts to the left or to his right and he had no falls.  He claims that  he sometimes has lightheadedness with initiation of postural changes.   LABS:  The patient has CK-MB of 4.2, troponin of 0.03.  HDL was 24.  Magnesium was 2.6 and therefore high.  Sodium was normal at 159,  potassium at 4.4.  BUN is elevated at 94, creatinine is very elevated at  3.28.   ASSESSMENT:  Clinical description could fit a mental status change due  to fluctuating blood glucose levels.  Blood pressure or heart rate could  also have a role in this.  I keep in the differential that this could be  a nonconvulsive seizure disorder.  I have ordered an EEG including  hyperventilation.  The patient should be on telemetry.  I would like for  him to take Neurontin for the presumed neuropathy at a low dose either  of 100 mg t.i.d. or 300 mg just nightly, which could also control  possible seizures.  An MRI, noncontrast of the brain was ordered.   Sincerely,      Melvyn Novas, M.D.  Electronically Signed     CD/MEDQ  D:  08/23/2007  T:  08/24/2007  Job:  [id number]   cc:   Dyke Maes, M.D.  Lyn Records, M.D.

## 2010-09-09 NOTE — H&P (Signed)
NAMEVANDY, [name] NO.:  [id number]   MEDICAL RECORD NO.:  [id number]          PATIENT TYPE:  EMS   LOCATION:  MAJO                         FACILITY:  [facility]   PHYSICIAN:  Vania Rea, M.D. DATE OF BIRTH:  1940/11/18   DATE OF ADMISSION:  12/28/2007  DATE OF DISCHARGE:                              HISTORY & PHYSICAL   PRIMARY CARE PHYSICIAN:  Dr. Marvel Plan.   CHIEF COMPLAINT:  Recurrent syncope.   HISTORY OF PRESENT ILLNESS:  This is a 70 year old Caucasian gentleman  with a history of hypertension, chronic kidney disease, diabetes and  systolic heart failure who has a history of recurrent syncopal episodes,  the cause of which has never been clearly defined.  At the last  investigation, it was presumed to be due to lisinopril, and the  patient's lisinopril was discontinued in favor of Micardis.  The patient  reports that after working out in his garden this morning he decided to  take his dog for a walk, and the next thing he remembered was waking up  on the floor on his back.  The patient presumed he had fainted and hit  his head on the kitchen counter.  He is not sure exactly what time this  happened or about how long he was out, but he sat down to get himself  together for about 10 minutes and then got up again, took a couple of  steps and had another episode.  Friends came in and found him and  checked his blood sugar, found his blood sugar to be 2010, and  encouraged him to take another 20 units of Lantus even though he had  already taken his morning dose of 36 units.  They also brought him along  to the emergency room, where he was evaluated and the hospitalist  service called to assist with management.   The patient denies any prodromal symptoms to these syncopal episodes.  He denies any chest pain, chest tightness or palpitations.  He denies  any shortness of breath.  He takes only Lantus for his blood sugar.  He  is prescribed Apidra to take  with meals, but he does not take it.   PAST MEDICAL HISTORY:  Hypertension, chronic kidney disease stage III,  cardiomyopathy with ejection fraction of 38% diabetes type 2, history of  left lower extremity DVT in 2003, osteoarthritis, history of  nephrolithiasis, resolved with lithotripsy.   MEDICATIONS:  1. Aspirin 81 mg daily.  2. BiDil 1 tablet 3 times daily.  3. Clonidine 0.2 mg twice daily.  4. Coreg 12.5 mg twice daily.  5. Lantus 36 units each morning.  6. Lasix 40 mg daily.  7. Micardis 40 mg daily.  8. Simvastatin 80 mg daily.   ALLERGIES:  PENICILLIN.   SOCIAL HISTORY:  Smokes 2 packs per day and been doing this for over 55  years.  Drinks alcohol very occasionally.  Denies illicit drug use.   FAMILY HISTORY:  Significant for diabetes and acute myocardial  infarction in his father, his mother also with coronary artery disease,  CABG x6.  REVIEW OF SYSTEMS:  Other than noted above, significant only for  persistent burning of his feet,  otherwise, a 10-point review of systems  was unremarkable.   PHYSICAL EXAMINATION:  GENERAL:  Elderly Caucasian gentleman, lying on  the stretcher, not acutely distressed at this time.  VITALS:  Temperature is 96.7, pulse 65, respirations 18, blood pressure  165/78, saturating at 97% on 2 liters. on initial presentation systolic  blood pressure dropped 19 points from 143 to 124, lying to standing.  HEENT:  Pupils are round and equal.  Mucous membranes pink.  Anicteric.  He has no subconjunctival hemorrhage.  NECK:  He has no cervical lymphadenopathy.  No thyromegaly.  No jugular  venous distention.  CHEST:  Clear to auscultation bilaterally.  CARDIOVASCULAR:  Regular rhythm.  No murmur heard.  ABDOMEN:  Obese, soft and nontender.  EXTREMITIES:  Very tender to the touch.  The left lower extremity is  slightly larger than the right and has extensive varicosities but has 3+  bounding pulses.  On the right, the foot is warm but the  dorsalis pedis  pulse was not felt.  CENTRAL NERVOUS SYSTEM:  Cranial nerves II-XII are grossly intact and he  has no lateralizing signs.   LABORATORIES:  His hemoglobin is 13.3, his sodium 138, potassium 3.8,  chloride 107, BUN 63, creatinine 3.8, glucose 143.  His beta-type  natriuretic peptide is 51.  Urinalysis:  His urine was cloudy.  He had  over 300 protein, nitrite positive, leukocyte esterase large.  Repeat  capillary blood glucose was 135.  Chest x-ray shows no acute lung  disease.  CT scan of the brain shows no acute intracranial abnormality.  There is a soft tissue right supraorbital contusion noted.  CT of his  cervical spine shows nothing acute but just degenerative disease.  His  EKG taken on admission shows sinus bradycardia at a rate of 58 with  first-degree AV block and no significant changes compared to his EKG of  August 13.   ASSESSMENT:  1. Recurrent syncopal episodes.  2. Chronic renal chronic with creatinine of 3.8 (baseline 2.5).  3. Diabetes type 2.  4. Hypertension.  5. Cardiomyopathy with systolic heart failure.  6. History of deep venous thrombosis.   PLAN:  1. Since this syncopal episode could be due to heart block, he does      have first-degree heart block on his EKG, he could be having      episodes of incomplete block.  We will admit him to telemetry and      get cardiology consult.  He could also be having orthostasis from      dehydration, since his creatinine is increased above baseline 2.5.      We will consult nephrology and we will hydrate him gently.  His      orthostasis may simply be related to his diabetes and his lower      extremity varicosities.  2. There is a possibility of venous thromboembolism causing his      symptoms.  He is having no respiratory symptoms, but we will go      ahead and get bilateral lower extremity Dopplers.  He does have an      enlarged left leg.  3. The patient took extra Lantus today because his blood  sugar was      elevated after the syncopal episodes.  We will need to monitor his      blood sugars very carefully for the  next 24 to 48 hours.  4. Other plans as per orders.      Vania Rea, M.D.  Electronically Signed     LC/MEDQ  D:  12/28/2007  T:  12/28/2007  Job:  [id number]   cc:   Dyke Maes, M.D.  Lyn Records, M.D.  Dr. Oneta Rack

## 2010-09-09 NOTE — Op Note (Signed)
NAMEKEYMARI, SATO                ACCOUNT NO.:  [id number]   MEDICAL RECORD NO.:  [id number]         PATIENT TYPE:  LINP   LOCATION:                                FACILITY:  [facility]   PHYSICIAN:  [place] Service, M.D.DATE OF BIRTH:  May 27, 1940   DATE OF PROCEDURE:  03/16/2007  DATE OF DISCHARGE:                               OPERATIVE REPORT   UROLOGIST:  [place] Service, M.D.   PREOPERATIVE DIAGNOSIS:  A 70 year old male.  Coronary artery disease.  Nephrotic range proteinuria, diabetes, hypertension, congestive heart  failure and recently discovered 15 mm calculus within the left mid  ureter.   POSTOPERATIVE DIAGNOSIS:  A 71 year old male.  Coronary artery disease.  Nephrotic range proteinuria, diabetes, hypertension, congestive heart  failure and recently discovered 15 mm calculus within the left mid  ureter.   PROCEDURE:  Cystoscopy, retrogrades, ureteroscopy, holmium laser  fragmentation of the stone and stent placement.   ANESTHESIA:  General drains 6-French 24 cm double-J stent.   DESCRIPTION OF PROCEDURE:  The patient was prepped and draped in the  dorsolithotomy position after institution of an adequate level of  general anesthesia.  Well lubricated 21-French panendoscope was gently  inserted at the urethral meatus, normal urethra and sphincter,  nonobstructive prostate, small capacity bladder.  Tight pinpoint  orifices on the trigone.  No obvious intravesical pathology.   Retrograde films were performed, blocking catheter was selected.  Normal  course and caliber of the ureter, pelvis and calyces on the right with  prompt drainage at three to five  minutes.  Similar technique was used  on the left side and showed a tortuous left ureter, particularly above  the vessels with a 15 mm filling defect that appeared to be  longstanding.  With gentle injection of contrast, there was outlining of  a profoundly dilated proximal ureter, pelvis and calyces.  The Glide  wire  was then inserted through the end-hole catheter, negotiated beyond  the stone and appeared to coil in the upper pole calyces.  End-hole  catheter was advanced.  The Glide wire was removed and there was an  immediate hydronephrotic drip.  This was allowed to subside.  Appropriate retrograde films were obtained.  A guidewire was inserted  and the end-hole catheter was withdrawn.  The short 6-French  ureteroscope was then inserted alongside the guidewire.  Stone was  localized at about the level of the mid ureter, holmium laser treatment  initiated with the 365 fiber at initial setting of 0.6 and then slowly  advanced to 0.8.  With continued treatment of the stone, it appeared to  break into fragments and then the fragments migrated into the dilated  renal pelvis beyond the reach of the short 6-French ureteroscope.  Once  all fragments that could be reached with the scope had been treated, the  ureteroscope was gently withdrawn.  A 6-French 24 cm double-J stent was  then advanced over the guidewire  with what appeared to be excellent pigtail formation in the left renal  pelvis and in the bladder.  With expectation of a brief with brisk  postobstructive  diuresis on the left 18-French Foley catheter was  inserted and left to straight drain.  The patient was given a B&O  suppository and returned to recovery in satisfactory condition.           ______________________________  Boston Service, M.D.     RH/MEDQ  D:  03/16/2007  T:  03/17/2007  Job:  [id number]   cc:   Lyn Records, M.D.  Fax: [phone]
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
## 2010-09-09 NOTE — Consult Note (Signed)
NAMEKUSHAL, [name]                ACCOUNT NO.:  [id number]   MEDICAL RECORD NO.:  [id number]          PATIENT TYPE:  INP   LOCATION:  3712                         FACILITY:  [facility]   PHYSICIAN:  Dyke Maes, M.D.DATE OF BIRTH:  [birth year]   DATE OF CONSULTATION:  DATE OF DISCHARGE:                                 CONSULTATION   REASON FOR CONSULTATION:  Acute on chronic renal insufficiency.   HISTORY OF PRESENT ILLNESS:  This is a 70 year old white male with a  history of chronic kidney disease stage III/IV, secondary diabetes and  hypertension, who was admitted yesterday with confusion, hypotension and  bradycardia.  I saw him on August 01, 2007, and he was in his usual state  of health.  His blood pressure was slightly on high side and his  lisinopril was increased from 20 mg to 40 mg per day.  He does state, he  has not been taking his blood pressures faithfully at home.  He did have  a follow up serum creatinine after the increase in lisinopril on August 16, 2007, which showed a relatively stable serum creatinine of 2.4, and  BUN of 63.  According to family and friends, he has had a 2 week history  of intermittent confusion and possibly some aphasia.  He has had poor  intake over the last 1-2 weeks as well.  There is a question about  whether he was taking his medicines properly given his confusion.  His  brother apparently found him confused at home yesterday, and took a  systolic blood pressure which was 70.  He brought him to the emergency  room and the first vital signs recorded in the emergency room were  systolic blood pressure 104, diastolic 55, and pulse of 31.  Laboratory  done yesterday showed serum creatinine increased to 4.0, however, today  has trended down to 3.2.   PAST MEDICAL HISTORY:  1. Significant for diabetes x16 years.  2. Hypertension.  3. Coronary artery disease.  4. History of DVT in his left lower extremity in 2003.  5. History of  nephrolithiasis status post a recent obstruction of the      left ureter in 11/08, which required a ureteroscopy and laser      treatment with stent placement.   REVIEW OF SYSTEMS:  Cardiomyopathy with an EF of 38%.   ALLERGIES:  PENICILLIN.   MEDICATIONS:  Prior to admission include,  1. Aspirin 325 mg a day  2. Zocor 80 mg a day.  3. BiDil 1 t.i.d.  4. Lasix 40 mg daily.  5. Coreg 12.5 mg b.i.d.  6. Clonidine 0.2 mg t.i.d.  7. Amlodipine 5 mg a day.  8. Lisinopril 40 mg a day.   SOCIAL HISTORY:  He has a 50-pack-year history of smoking and continues  to smoke.  He is widowed for 4 years.  He is a retired Pharmacologist.  He  occasionally drinks alcohol.   FAMILY HISTORY:  Mother has heart disease, currently she is alive at age  18.  Father has Parkinson disease.  There is no  family history of renal  disease.   REVIEW OF SYSTEMS:  Appetite has been decreased though he is feeling  better now.  He has had some episodes of dizziness, which may be related  to his low blood pressure and no recent change in bowel habits.  No new  arthritic complaints.  No new neuropathic symptoms.  No chest pains or  chest pressures.  No shortness of breath, PND, or orthopnea.  No change  in vision.   PHYSICAL EXAMINATION:  VITAL SIGNS: Blood pressure 140/80, pulse 64, and  temperature 98.2.  GENERAL: A 70 year old white male in no acute distress.  HEENT: Sclera nonicteric.  His muscles are intact.  NECK: Reveals no JVD.  LUNGS: Clear to auscultation.  HEART: Regular rate and rhythm without murmur or gallop.  ABDOMEN: Positive bowel sounds, nontender, and nondistended.  No  hepatosplenomegaly.  EXTREMITIES:  No clubbing, cyanosis or edema.  Pulses 2/4 in the  carotids, radials, femorals, 1/4 dorsalis pedis and posterior tibial.  NEUROLOGIC: Cranial nerves intact.  Motor and sensory intact.  No  asterixis.   LABORATORY DATA:  Sodium 139, potassium 4.4, bicarb 20, BUN 94,  creatinine 3.2,  hemoglobin 10.8, white count 8.1, and platelet count  121,000.   IMPRESSION:  Acute on chronic renal disease.  I suspect this acute  component secondary hemodynamic changes associated with hypotension,  bradycardia, dehydration and ACE inhibition.  The question is what was  the inciting event.  The increase in his lisinopril, the decrease in  blood pressure, and increasing in BUN and creatinine, and then  confusion, although this certainly does not explain his bradycardia, or  the bradycardia cause the hypotension, confusion, and worsening of renal  function.   RECOMMENDATIONS:  1. I would hold the lisinopril for now.  We will have to make a      decision at a later date regarding resumption.  2. IV fluids to ensure he is well hydrated.  3. Daily serum creatinine.  4. I agree with the cardiac evaluation as you have done.           ______________________________  Dyke Maes, M.D.     MTM/MEDQ  D:  08/23/2007  T:  08/24/2007  Job:  [id number]

## 2010-09-12 NOTE — Discharge Summary (Signed)
[facility]  Patient:    [name], [name] Visit Number: [id number] MRN: [id number]          Service Type: MED Location: [id number] Attending Physician:  McDiarmid, Leighton Roach. Dictated by:   Harrold Donath, M.D. Admit Date:  09/21/2001 Discharge Date: 09/22/2001   CC:         Tomi Bamberger, M.D. - [facility]   Discharge Summary  PROCEDURE:  A lower extremity Doppler was done which showed a deep venous thrombosis in the left lower extremity.  DISCHARGE DIAGNOSES 1. Left lower extremity deep venous thrombosis. 2. Diabetes mellitus.  DISCHARGE MEDICATIONS 1. Coumadin 5 mg p.o. q.d. 2. Lovenox 110 mg subcu b.i.d. 3. Glucotrol XL 5 mg p.o. b.i.d. 4. Altace 2.5 mg p.o. q.d. 5. Percocet 5/325 mg one tab p.o. q.4h. p.r.n. pain.  HISTORY OF PRESENT ILLNESS:  The patient is a 70 year old white male who presents to the hospital for anticoagulation from the diagnosis of DVT at his primary care physicians office.  HOSPITAL COURSE #1 - DEEP VENOUS THROMBOSIS:  The patient developed lower extremity cellulitis after an injury with a tree branch.  He noticed after his antibiotic course was completed, that the swelling and pain did not improve.  He saw his primary care physician, who diagnosed him with a deep venous thrombosis.  The patient was placed on Coumadin and Lovenox.  He was taught in the hospital how to self-inject the Lovenox, so that he could be discharged on the following morning after admission.  The patient did well overnight and had no complaints.  As far as the etiology of the DVT, it is possible that this was exacerbated by the cellulitis; however, in a 70 year old gentleman with no history of recent immobility or history of malignancy, the medical team did consider a new malignancy that has been undiagnosed.  He was noted to have his PSA checked in the last three months.  A complete metabolic panel was checked, and  of note, his lipase was just slightly elevated at 53.  To reassure his primary care physician, we suggest that his lipase be repeated in one week, to rule out any sort of pancreatic cancer.  #2 - DIABETES MELLITUS:  The patient was continued on his home regimen.  A hemoglobin A1c was checked and was noted to be 12.3.  His medications should probably be adjusted at his primary care physicians office.  The patient may be noncompliant as well, and this needs to be addressed, especially in light of the fact that he needs to give himself twice-a-day injections, and daily Coumadin.  CONDITION ON DISCHARGE:  The patient was discharged to home in a stable condition.  INSTRUCTIONS GIVEN:  The patient was told to present to [facility] on the Friday after admission, for an INR and PT.  It is also recommended that he follow up on the Monday after to receive another blood draw.  He will need his lipase rechecked in one week as well.  His medical regimen was discussed with him, and he was instructed as to how to give the Lovenox injections. Dictated by:   Harrold Donath, M.D. Attending Physician:  McDiarmid, Tawanna Cooler D. DD:  09/23/01 TD:  09/26/01 Job: [id number]

## 2010-09-12 NOTE — Op Note (Signed)
NAMEJOHNATHA, ZEIDMAN                ACCOUNT NO.:  [id number]   MEDICAL RECORD NO.:  [id number]          PATIENT TYPE:  AMB   LOCATION:  SDS                          FACILITY:  [facility]   PHYSICIAN:  Vanita Panda. Magnus Ivan, M.D.DATE OF BIRTH:  [birth year]   DATE OF PROCEDURE:  10/06/2005  DATE OF DISCHARGE:                                 OPERATIVE REPORT   PREOPERATIVE DIAGNOSIS:  Left thumb table saw injury with flexor tendon  injury, extensor tendon injury, digital nerve and artery injury.   POSTOPERATIVE DIAGNOSIS:  Left thumb table saw injury with flexor tendon  injury, extensor tendon injury, digital nerve and artery injury.   PROCEDURE:  1.  Left thumb exploration with irrigation and debridement.  2.  Left thumb flexor pollicis longus (FPL) tendon repair direct primary      repair.  3.  Partial extensor pollicis longus tendon laceration with direct primary      repair.  4.  Open reduction previous pinning of left thumb proximal phalanx fracture.   SURGEON:  Vanita Panda. Magnus Ivan, M.D.   ANESTHESIA:  General.   TOURNIQUET TIME:  48 minutes.   ANTIBIOTICS:  1 gram IV Ancef.   BLOOD LOSS:  Minimal.   COMPLICATIONS:  None.   INDICATIONS:  Briefly, Mr. Todorov is a 70 year old who last Thursday cut his  nondominant left thumb nearly off with a table saw. He was seen [facility] in [place] and the soft tissues were closed. He had a partially  perfused thumb with obvious tendon and neurovascular bundle injury and was  given follow-up in our clinic since we were on hand call. I assessed him  yesterday and found out that he also had a long history of smoking as well  as a diabetic. I recommended exploration of the thumb with potential repair  if structures were viable and had the ability to be repaired in spite of  severe table saw injury.  The risks, benefits of this were explained and  well understood and he did understand that there is potential for him to  have  a quite stiff thumb even if it was viable.   PROCEDURE:  After informed consent was obtained left arm was marked, Mr.  Mantz was brought to the operating room, placed supine on the operative  table with the arm on the radiolucent arm table.  General anesthesia was  then obtained. A nonsterile tourniquet placed around his upper arm.  His arm  and hand were prepped and draped with the Betadine scrub and paint.  An  Esmarch was used to wrap out the hand and then the thumb had a large volar  laceration that extended all along the radial border was assessed.  He was  found to have complete disruption of the entire neurovascular bundle on the  radial side of his thumb. I tried to get length out on the vessels and all  of these were entirely destroyed and I could not get length.  The FPL tendon  was completely destroyed and I was able to find both the proximal and distal  stumps  and brought these together with the direct primary repair near the  insertion of the FPL tendon.  This was performed with 3-0 Ethibond suture  using a modified Kessler technique and then I did an epitendinous suture  using a 5-0 Prolene suture.  Once this tendon repair was undertaken, the  incision from the previous wound on the radial side of the thumb was carried  to the extensor side of the thumb and there was at least a 25-50% rupture of  the EPL tendon. Using a 4-0 FiberWire suture.  I brought these ends together  as well.  Again the neurovascular bundle was found to be in significant  disruption and I could not bring these for repair.  Next the bone was  assessed and found to have a proximal phalanx fracture that was extra-  articular but at the neck of the proximal phalanx. I was able to position  this in reduced position and then used two 0.035 K-wires in a crossing  pattern to secure the fracture.  This was verified under direct fluoroscopy.  The thumb then itself looked quite dusky. I did put a needle in the  thumb  and it did bleed after the tourniquet was let down. All wounds were then  copiously irrigated and closed the skin with interrupted 3-0 Prolene suture.  The pins were bent and then cut outside the skin.  Xeroform followed by  thumb spica splint with plaster thumb spica splint was placed.  The patient  was awakened, extubated and taken to recovery in stable condition.  I talked  with the family and assessed the patient in the recovery room and I do  believe the thumb is going to become nonviable given the nature of the  injury as well as his smoking, diabetes and I believe he is going to end up  with a revision amputation of the tip of the thumb. However, since it did  show some blood flow though sluggish, we will give this a chance.           ______________________________  Vanita Panda. Magnus Ivan, M.D.     CYB/MEDQ  D:  10/06/2005  T:  10/07/2005  Job:  [id number]

## 2010-09-12 NOTE — Discharge Summary (Signed)
[name], [name]                ACCOUNT NO.:  [id number]   MEDICAL RECORD NO.:  [id number]          PATIENT TYPE:  INP   LOCATION:  3703                         FACILITY:  [facility]   PHYSICIAN:  Lyn Records, M.D.   DATE OF BIRTH:  Dec 16, 1940   DATE OF ADMISSION:  03/11/2007  DATE OF DISCHARGE:  03/17/2007                               DISCHARGE SUMMARY   DISCHARGE DIAGNOSES:  1. Acute diastolic heart failure, improved.  2. Chronic kidney disease secondary to diabetes mellitus and      hypertension.  3. Diabetes mellitus.  4. Hypertension.  5. A 15 millimeter left ureteral calcification, status post      cystoscopy/retrograde ureteroscopy.   HOSPITAL COURSE:  [name] is a 70 year old male who was admitted on  March 11, 2007 with increasing shortness of breath and lower  extremity edema that had worsened over the past  2-3 days.  He was found  to be in diastolic heart failure and was aggressively diuresed.   LABORATORY DATA:  During his hospitalization, his lab work showed white  count of 8.1, hemoglobin 10.9, hematocrit 32.0, platelets 129, sodium  142, potassium 3.9, BUN 26, creatinine 2.51, hemoglobin A1c 5.5, maximum  CK 595 with 15 MB fractions, troponin 0.09 (felt secondary to volume  overload), total cholesterol 239, triglycerides 196, HDL 32, LDL 168,  TSH 2.129, PTH intact 10.1, calcium per PTH 8.1, total protein urine  5170.   The patient had some chest comfort and we did perform a Cardiolite that  showed no evidence of pharmacologic-induced myocardial ischemia.  Of  note, his EF was 38%.  His  heart failure was also felt to be systolic  in nature.   Of note, he did have renal insufficiency as described above.  For this  reason, a renal consult was obtained.  A renal ultrasound showed  moderate left hydronephrosis and subsequent CT of the pelvis/abdomen  showed a large 10 x 11 x 15 mm size proximal left ureteral calculus  producing marked left  hydronephrosis.   For this reason, an urology consult was obtained from Dr. Wanda Plump.  The patient underwent cystoscopy/retrograde ureteroscopy on March 16, 2007.  By March 17, 2007, the patient was felt to be ready for  discharge to home.  Lab studies are as noted above.   FOLLOW UP:  1. The patient is to make a follow up appointment with Dr. Wanda Plump      as well as the renal service.  2. Cardiology follow up with Dr. Katrinka Blazing on March 29, 2007 at 2 p.m.   DIET:  The patient is to remain on a low-sodium, heart-healthy, diabetic  diet.   DISCHARGE MEDICATIONS:  1. Enteric-coated aspirin 325 mg a day.  2. Zocor 80 mg q.h.s.  3. BiDil 1 tablet t.i.d.  4. Lasix 40 mg once a day.  5. Lantus insulin as prior to admission.  6. Coreg 6.25 mg 1 tablet twice a day.  7. Cipro 500 mg daily for urinary tract reasons.  8. Clonidine 0.1 mg p.o. t.i.d.  9. Sublingual nitroglycerin p.r.n. chest pain.  DISPOSITION:  The patient was discharged home in stable, but improved  condition.      Guy Franco, P.A.      Lyn Records, M.D.  Electronically Signed    LB/MEDQ  D:  04/07/2007  T:  04/08/2007  Job:  [id number]   cc:   Lyn Records, M.D.  [employer] [facility], M.D.  [facility]

## 2010-09-12 NOTE — Discharge Summary (Signed)
[name], [name]                ACCOUNT NO.:  [id number]   MEDICAL RECORD NO.:  [id number]          PATIENT TYPE:  INP   LOCATION:  2620                         FACILITY:  [facility]   PHYSICIAN:  Ladell Pier, M.D.   DATE OF BIRTH:  1940-12-22   DATE OF ADMISSION:  12/28/2007  DATE OF DISCHARGE:  12/29/2007                               DISCHARGE SUMMARY   The patient left AMA.  The patient left on January 28, 2008, [place].   DISCHARGE DIAGNOSIS:  1. Recurrent syncopal episodes.  2. Chronic renal insufficiency.  Creatinine 3.8, baseline creatinine      2.5.  3. Diabetes.  4. Hypertension.  5. Cardiomyopathy.  6. History of DVT with a Doppler ultrasound that showed a new DVT.  7. Question of seizure disorder.   DISCHARGE MEDICATIONS:  The patient was not discharged on any  medications since he left AMA.   PROCEDURES:  EEG.   CONSULTANTS:  Neurology.   HISTORY OF PRESENT ILLNESS:  The patient is 70 year old white male past  medical history significant for hypertension, chronic kidney disease,  diabetes, systolic heart failure, history of recurrent syncopal episode,  the cause not clearly defined.  The patient presents to the hospital  after another syncopal episode.  He hit his head on the kitchen counter  during that episode.  Please see admission note for remainder of  history, past medical history, family history, social history,  medications, allergies, review of systems per admission H&P.   PHYSICAL EXAMINATION:  VITAL SIGNS:  Temperature 98.2, pulse 80,  respirations 22, blood pressure 192/92, pulse ox 96% on room air.  CBG  91.  GENERAL:  The patient did not seem to be in any acute distress.  CARDIOVASCULAR:  Regular rate and rhythm.  ABDOMEN:  Positive bowel sounds.  EXTREMITIES:  Edema 1+ in the left lower extremity.   HOSPITAL COURSE:  1. Seizure episode.  Neurology saw the patient.  He was started on      carbamazepine.  The patient left AMA before evaluation  could be      completed and the patient could be discharged on medications.  2. Syncopal episode, question of the etiology.  He did not have any      cardiac event on monitor.  The patient was noted on the Doppler      ultrasound to have a DVT.  The patient left without being treated      for his DVT.  3. Diabetes.  He was started on Lantus and sliding scale insulin.  4. CHF while he was inpatient.  CHF was stable.  5. Blood pressure.  His blood pressure was mildly elevated during his      hospitalization.  He was continued on his outpatient medications.  6. Renal insufficiency.  Creatinine was monitored and came down to      2.34.  7. Left lower extremity DVT, was on Coumadin and Lovenox, and the      patient left before his INR was therapeutic.   DISCHARGE LABORATORIES:  PT 14.4, INR 1.1.  sodium 138, potassium 3.4,  chloride  102, CO2 27, glucose 126 been 44, creatinine 2.34.  At the  discharge, problem was hypokalemia:  The patient's potassium was  slightly low, it was repleted.  WBC 7.6, hemoglobin 13.5, platelets 149.  Doppler ultrasound showed left lower extremity positive for DVT of the  common femoral vein.      Ladell Pier, M.D.  Electronically Signed     NJ/MEDQ  D:  02/21/2008  T:  02/22/2008  Job:  [id number]

## 2010-09-12 NOTE — H&P (Signed)
[facility]  Patient:    JAMES, SENN Visit Number: [id number] MRN: [id number]          Service Type: MED Location: [id number] Attending Physician:  McDiarmid, Leighton Roach. Dictated by:   Solon Palm, M.D. Admit Date:  09/21/2001 Discharge Date: 09/22/2001   CC:         [facility]   History and Physical  CHIEF COMPLAINT:  Deep venous thrombosis.  HISTORY OF PRESENT ILLNESS:  The patient is a 70 year old white male who presents to [facility] for anticoagulation to treat his DVT. About two months ago, he scratched his right leg with a tree branch and developed an infection.  He took oral antibiotics and the redness improved; however, the swelling and the pain did not.  He admits to having been relatively immobile but he did continue to carry on his home activities.  He denies any long travel, any recent fractures.  The patient denies chest pain and states he occasionally has dyspnea on exertion over the past two weeks.  No hemoptysis.  PAST MEDICAL HISTORY: 1. Diabetes in 1994.  No prior medical care. 2. Hypertension in 1994. 3. Arthritis. 4. History of renal calculi. 5. History of lipoma removal.  MEDICATIONS: 1. Glucotrol XL 5 mg p.o. b.i.d. 2. Anaztin(?) 5 mg q.a.m. 3. Altace 2.5 mg, then 5 mg, then 10 mg. 4. Advil or Aleve p.r.n. 5. Tylenol p.r.n. 6. Tagamet p.r.n. reflux.  SOCIAL HISTORY:  The patient has been married for 43 years.  Retired Pharmacologist third shift.  Positive tobacco, half pack a day for 50 years. Occasional alcohol.  No illicit substances.  He has an involved family.  One grandson.  FAMILY HISTORY:  Father died at 53 years old with CHF, diabetes, and Parkinsons disease.  Mother is alive at 60 years old with CAD.  He has healthy siblings.  REVIEW OF SYSTEMS:  GENERAL:  He has an 80 pound weight loss.  No mood changes.  No recent illness.  ALLERGIES:  No allergies.   HEENT:  No epistaxis. Left eye blurry.  Right vision is normal.  No recent ophthalmic evaluation. GASTROINTESTINAL:  Occasional reflux.  No melena or hematochezia.  No pain. NEUROLOGIC:  No weakness and no headache.  PHYSICAL EXAMINATION:  VITAL SIGNS:  Temperature 98.7, heart rate 76, respirations 16, blood pressure 129/83.  O2 saturation 94%.  Blood sugars 303.  GENERAL:  Pleasant and in no acute distress.  Awake, alert, and oriented x 3.  HEENT:  Normocephalic.  PERRL.  There is patent missing teeth.  Mucus was moist.  No obvious infection.  NECK:  No lymphadenopathy.  HEART:  Pulses +2 in bilateral brachial, femoral pulses.  No carotid bruits. PMI difficult to assess.  Distant heart sounds.  Regular rate and rhythm.  No murmurs or gallops appreciated.  LUNGS:  In his expiratory phase, no wheezes, rales, or rhonchi noted.  Fair air movement in all lung fields.  ABDOMEN:  Obese, soft, nontender, and nondistended.  No hepatosplenomegaly is noted.  Positive bowel sounds are appreciated.  RECTAL:  Not performed.  EXTREMITIES:  Right lower extremity is normal.  Left lower extremity is mottled.  ______ wound below knee.  LABORATORY DATA:  Labs are ordered and pending.  IMPRESSION:  A 70 year old white male with a deep venous thrombosis.  ASSESSMENT/PLAN: 1. Deep venous thrombosis has been immobile but with a heavy smoking history    and a recent weight  loss, though intentional.  Agree to workup including    chest x-ray.  I will come in checking off his records in the morning    regarding other cancer screening including hemoccult.  He is due for a    flexible sigmoidoscopy/colonoscopy.  PSA is normal per patient.  For deep    venous thrombosis, the patient is to begin low molecular weight heparin    and Coumadin.  We will follow labs and finally will give recommendations.    I appreciate Mr. Burman Freestone management.  Assistant was obtained with medicines    from the patients  home. 2. Diabetes:  ADA diet.  Continue home medications. 3. Hypertension:  Continue Altace.  Not certain about other pill.  We will    inquire from office records what is his other medication. 4. Tobacco abuse cessation encouraged:  He may require the patch in house.    Disposition hopefully will be going home and this will be a short    hospitalization if we give him low molecular weight heparin. Dictated by:   Solon Palm, M.D. Attending Physician:  McDiarmid, Tawanna Cooler D. DD:  09/21/01 TD:  09/22/01 Job: [id number]

## 2010-09-12 NOTE — Discharge Summary (Signed)
NAMEYISHAI, REHFELD                ACCOUNT NO.:  [id number]   MEDICAL RECORD NO.:  [id number]          PATIENT TYPE:  INP   LOCATION:  3712                         FACILITY:  [facility]   PHYSICIAN:  Lyn Records, M.D.   DATE OF BIRTH:  [birth year]   DATE OF ADMISSION:  08/23/2007  DATE OF DISCHARGE:  08/26/2007                               DISCHARGE SUMMARY   DISCHARGE DIAGNOSES:  1. Hypertension, better.  2. Renal insufficiency, improved.  3. AMS, most likely secondary to uremia/metabolic disorder.  4. Nonsustained ventricular tachycardia, ischemic versus electrolyte      imbalance.  5. Cardiomyopathy, EF 38% by Cardiolite in April 2008.   HOSPITAL COURSE:  Mell Guia is a 70 year old male patient who came  into the hospital on August 23, 2007, with a 7-10-day history of  intermittent confusion according to the family.  Other pertinent  information is that he admitted with syncopal episode about 4 months  ago, was looking breathless without any prodromal symptoms.  He did not  seek medical attention.  He has not had any other episodes of syncope  since that time.  He also stated that he had been taking these  medications as directed, but noticed that despite increasing his dose of  Lasix over the past few days, his urine output decreases.  He noted also  lower extremity swelling that is worsened over the past several days.  Aleksandr has complains that he feels fatigued.  EKG in the emergency room  showed ventricular bigeminy with a nonsustained ventricular tachycardia.  An echocardiogram during this patient's stay showed an EF of 70% with no  wall motion abnormalities.  Of note, the ejection fraction had increased  as compared to a previous study in November 2008.  We are certainly  concerned that he had underlying ischemia/coronary artery disease that  led to his ventricular tachycardia.  We wish to proceed with cardiac  catheterization, but because of this acute on chronic renal  failure, we  felt that this was impossible and it would be high risk.  We did have  the renal service assistance with his acute on chronic renal failure and  his ACE inhibitor was held.   His confusion warranted a neurology consult by Dr. Porfirio Mylar Dohmeier and  she felt that the clinical description of this patient consists a mental  status change due to fluctuating blood glucose levels.  An EEG was  ordered but the results of this tests are not available to me in the  chart or in the e-chart.  An MRI of the brain; however, was performed  and this showed extensive branch vessel segmental narrowing compatible  with intracranial atherosclerotic changes.  Fetal-type right posterior  cerebral artery.  There was no significant proximal stenosis, aneurysm,  or branch vessel occlusion.  There was also focal infarct involving this  splaying of the corpus callosum, slightly asymmetric to the right.  There was an additional punctate area of restricted diffusion within the  white matter adjacent to the atrium of the left lateral ventricle.  This  was greater than expected periventricular subcortical  white matter  changes bilaterally, which is nonspecific.  At this point, there was no  further neurologic workup performed.  On Aug 26, 2007, the patient was  discharged to home.  On August 25, 2007, his BUN was 43 and creatinine  was 1.93.  Apparently, we ordered labs for the date of discharge, but  the patient refused labs.  His IV was taken out and he was discharged to  home with these instructions.   MEDICATIONS:  1. Clonidine 0.2 mg twice a day.  2. Coreg 12.5 mg twice a day.  3. Zocor 80 mg a day.  4. Baby aspirin 81 mg a day.  5. Lasix 40 mg a day.  6. Norvasc 5 mg a day.  7. Lantus as before.  8. Neurontin as before.   The patient is to remain on a low-sodium, heart-healthy, renal diet.  Follow up with Dr. Katrinka Blazing or his team, [facility], nurse practitioner, on  Aug 31, 2007, at 9:00 a.m.  He is  to get his labs before this visit, lab  work to check his creatinine.  Followup with Dr. Katrinka Blazing on September 29, 2007,  at 3:30 p.m.  Dr. Edd Arbour office should call the patient for the  appointment.  I left a message for them to do so.  In addition, I have  asked the patient to stop his lisinopril until we can really assure that  his renal function is stable.      Guy Franco, P.A.      Lyn Records, M.D.  Electronically Signed    LB/MEDQ  D:  10/19/2007  T:  10/20/2007  Job:  [id number]   cc:   Dyke Maes, M.D.

## 2010-12-17 ENCOUNTER — Other Ambulatory Visit (HOSPITAL_COMMUNITY): Payer: Self-pay | Admitting: Internal Medicine

## 2010-12-17 ENCOUNTER — Ambulatory Visit (HOSPITAL_COMMUNITY)
Admission: RE | Admit: 2010-12-17 | Discharge: 2010-12-17 | Disposition: A | Discharge status: Home / Self Care | Payer: Medicare HMO | Source: Ambulatory Visit | Attending: Internal Medicine | Admitting: Internal Medicine

## 2010-12-17 DIAGNOSIS — F17200 Nicotine dependence, unspecified, uncomplicated: Secondary | ICD-10-CM | POA: Insufficient documentation

## 2010-12-17 DIAGNOSIS — I10 Essential (primary) hypertension: Secondary | ICD-10-CM | POA: Insufficient documentation

## 2010-12-17 DIAGNOSIS — I2510 Atherosclerotic heart disease of native coronary artery without angina pectoris: Secondary | ICD-10-CM | POA: Insufficient documentation

## 2010-12-17 DIAGNOSIS — E119 Type 2 diabetes mellitus without complications: Secondary | ICD-10-CM | POA: Insufficient documentation

## 2011-01-20 LAB — BASIC METABOLIC PANEL
BUN: 104 — ABNORMAL HIGH
BUN: 43 — ABNORMAL HIGH
BUN: 94 — ABNORMAL HIGH
CO2: 20
CO2: 24
CO2: 26
Calcium: 8.9
Calcium: 9
Chloride: 108
Creatinine, Ser: 1.93 — ABNORMAL HIGH
Creatinine, Ser: 4.05 — ABNORMAL HIGH
GFR calc non Af Amer: 19 — ABNORMAL LOW
Glucose, Bld: 160 — ABNORMAL HIGH
Glucose, Bld: 280 — ABNORMAL HIGH
Potassium: 4.4
Sodium: 139

## 2011-01-20 LAB — CARDIAC PANEL(CRET KIN+CKTOT+MB+TROPI)
CK, MB: 4.2 — ABNORMAL HIGH
CK, MB: 4.4 — ABNORMAL HIGH
CK, MB: 4.5 — ABNORMAL HIGH
Relative Index: INVALID
Total CK: 83
Total CK: 86
Troponin I: 0.02
Troponin I: 0.03

## 2011-01-20 LAB — CBC
Hemoglobin: 11.5 — ABNORMAL LOW
MCHC: 34.6
Platelets: 121 — ABNORMAL LOW
Platelets: 137 — ABNORMAL LOW
RBC: 3.51 — ABNORMAL LOW
RDW: 12.7
RDW: 12.9

## 2011-01-20 LAB — LIPID PANEL
LDL Cholesterol: 33
Total CHOL/HDL Ratio: 3.9
VLDL: 37

## 2011-01-20 LAB — RENAL FUNCTION PANEL
Albumin: 3.4 — ABNORMAL LOW
BUN: 59 — ABNORMAL HIGH
Creatinine, Ser: 2.39 — ABNORMAL HIGH
GFR calc Af Amer: 33 — ABNORMAL LOW
GFR calc non Af Amer: 27 — ABNORMAL LOW
Phosphorus: 4.4

## 2011-01-20 LAB — HEPATIC FUNCTION PANEL
ALT: 11
AST: 19
Alkaline Phosphatase: 50
Total Protein: 6.1

## 2011-01-20 LAB — POCT CARDIAC MARKERS: Myoglobin, poc: 445

## 2011-01-20 LAB — MAGNESIUM: Magnesium: 2.6 — ABNORMAL HIGH

## 2011-01-23 LAB — CBC
HCT: 36.7 — ABNORMAL LOW
MCV: 89.7
Platelets: 166
WBC: 9.1

## 2011-01-23 LAB — DIFFERENTIAL
Eosinophils Absolute: 0.1
Eosinophils Relative: 1
Lymphs Abs: 1.6
Monocytes Relative: 6

## 2011-01-23 LAB — POCT I-STAT, CHEM 8
Creatinine, Ser: 2.8 — ABNORMAL HIGH
Glucose, Bld: 208 — ABNORMAL HIGH
Hemoglobin: 12.2 — ABNORMAL LOW
Sodium: 138
TCO2: 24

## 2011-01-23 LAB — POCT CARDIAC MARKERS: Troponin i, poc: <0.05

## 2011-01-28 LAB — CBC
HCT: 39.4
HCT: 40.2
Hemoglobin: 13.3
Hemoglobin: 13.5
MCHC: 33.5
MCHC: 33.7
MCV: 92.5
MCV: 92.5
RBC: 4.26
RDW: 13.3

## 2011-01-28 LAB — PROTIME-INR: INR: 1.1

## 2011-01-28 LAB — POCT I-STAT, CHEM 8
Chloride: 107
HCT: 39
Potassium: 3.8

## 2011-01-28 LAB — URINE CULTURE: Colony Count: >=100000

## 2011-01-28 LAB — BASIC METABOLIC PANEL
CO2: 27
CO2: 27
Chloride: 104
Creatinine, Ser: 2.85 — ABNORMAL HIGH
GFR calc Af Amer: 27 — ABNORMAL LOW
Glucose, Bld: 126 — ABNORMAL HIGH
Potassium: 3.4 — ABNORMAL LOW
Potassium: 3.6
Sodium: 138

## 2011-01-28 LAB — DIFFERENTIAL
Basophils Relative: 0
Eosinophils Absolute: 0.1
Eosinophils Relative: 2
Monocytes Absolute: 0.8
Monocytes Relative: 10
Neutrophils Relative %: 71

## 2011-01-28 LAB — GLUCOSE, CAPILLARY
Glucose-Capillary: 135 — ABNORMAL HIGH
Glucose-Capillary: 188 — ABNORMAL HIGH

## 2011-01-28 LAB — URINALYSIS, ROUTINE W REFLEX MICROSCOPIC
Ketones, ur: 15 — AB
Nitrite: POSITIVE — AB
Protein, ur: >300 — AB

## 2011-01-28 LAB — B-NATRIURETIC PEPTIDE (CONVERTED LAB): Pro B Natriuretic peptide (BNP): 51

## 2011-01-28 LAB — POCT CARDIAC MARKERS
Myoglobin, poc: >500
Troponin i, poc: <0.05

## 2011-01-28 LAB — URINE MICROSCOPIC-ADD ON

## 2011-02-03 LAB — CK TOTAL AND CKMB (NOT AT ARMC)
CK, MB: 15 — ABNORMAL HIGH
Relative Index: 2.5
Total CK: 595 — ABNORMAL HIGH

## 2011-02-03 LAB — POCT CARDIAC MARKERS
CKMB, poc: 23
Myoglobin, poc: >500
Operator id: 272551
Troponin i, poc: <0.05

## 2011-02-03 LAB — CBC
HCT: 32 — ABNORMAL LOW
HCT: 32 — ABNORMAL LOW
HCT: 32.7 — ABNORMAL LOW
HCT: 32.8 — ABNORMAL LOW
HCT: 33.4 — ABNORMAL LOW
Hemoglobin: 10.8 — ABNORMAL LOW
Hemoglobin: 10.9 — ABNORMAL LOW
Hemoglobin: 11.1 — ABNORMAL LOW
Hemoglobin: 11.1 — ABNORMAL LOW
Hemoglobin: 11.5 — ABNORMAL LOW
MCHC: 33.7
MCHC: 33.8
MCHC: 34
MCHC: 34
MCHC: 34.3
MCV: 91.5
MCV: 91.8
MCV: 92.2
MCV: 93
MCV: 93
MCV: 93.1
MCV: 93.6
MCV: 91.2
Platelets: 129 — ABNORMAL LOW
Platelets: 130 — ABNORMAL LOW
Platelets: 145 — ABNORMAL LOW
Platelets: 148 — ABNORMAL LOW
Platelets: 163
Platelets: 164
Platelets: 182
RBC: 3.34 — ABNORMAL LOW
RBC: 3.44 — ABNORMAL LOW
RBC: 3.44 — ABNORMAL LOW
RBC: 3.52 — ABNORMAL LOW
RBC: 3.55 — ABNORMAL LOW
RDW: 13.3
RDW: 13.3
RDW: 13.7
RDW: 13.7
RDW: 13.7
WBC: 5.2
WBC: 6.7
WBC: 6.8
WBC: 6.9
WBC: 7.3
WBC: 8
WBC: 8.1
WBC: 7.8

## 2011-02-03 LAB — BASIC METABOLIC PANEL
BUN: 26 — ABNORMAL HIGH
CO2: 32
Calcium: 8.4
Calcium: 8.8
Calcium: 8.8
Chloride: 108
Creatinine, Ser: 2.23 — ABNORMAL HIGH
Creatinine, Ser: 2.32 — ABNORMAL HIGH
Creatinine, Ser: 2.49 — ABNORMAL HIGH
GFR calc Af Amer: 34 — ABNORMAL LOW
GFR calc Af Amer: 36 — ABNORMAL LOW
GFR calc non Af Amer: 26 — ABNORMAL LOW
GFR calc non Af Amer: 28 — ABNORMAL LOW
GFR calc non Af Amer: 30 — ABNORMAL LOW
Glucose, Bld: 147 — ABNORMAL HIGH

## 2011-02-03 LAB — RENAL FUNCTION PANEL
Albumin: 2.1 — ABNORMAL LOW
Albumin: 2.3 — ABNORMAL LOW
Albumin: 2.7 — ABNORMAL LOW
BUN: 19
BUN: 25 — ABNORMAL HIGH
BUN: 26 — ABNORMAL HIGH
CO2: 32
CO2: 35 — ABNORMAL HIGH
Calcium: 8.3 — ABNORMAL LOW
Calcium: 8.9
Chloride: 100
Chloride: 102
Creatinine, Ser: 2.11 — ABNORMAL HIGH
Creatinine, Ser: 2.3 — ABNORMAL HIGH
Creatinine, Ser: 2.51 — ABNORMAL HIGH
GFR calc non Af Amer: 26 — ABNORMAL LOW
GFR calc non Af Amer: 29 — ABNORMAL LOW
Glucose, Bld: 106 — ABNORMAL HIGH
Glucose, Bld: 116 — ABNORMAL HIGH
Glucose, Bld: 170 — ABNORMAL HIGH
Phosphorus: 3.8
Phosphorus: 4.1
Phosphorus: 4.9 — ABNORMAL HIGH
Potassium: 3.6
Potassium: 3.9
Sodium: 140
Sodium: 142

## 2011-02-03 LAB — URINALYSIS, ROUTINE W REFLEX MICROSCOPIC
Bilirubin Urine: NEGATIVE
Glucose, UA: NEGATIVE
Specific Gravity, Urine: 1.01
Urobilinogen, UA: 0.2
pH: 6

## 2011-02-03 LAB — IGG, IGA, IGM
IgA: 128
IgG (Immunoglobin G), Serum: 330 — ABNORMAL LOW

## 2011-02-03 LAB — PTH, INTACT AND CALCIUM
Calcium, Total (PTH): 8.1 — ABNORMAL LOW
PTH: 10.1 — ABNORMAL LOW

## 2011-02-03 LAB — HEPARIN LEVEL (UNFRACTIONATED)
Heparin Unfractionated: 0.36
Heparin Unfractionated: 0.44
Heparin Unfractionated: 1.2 — ABNORMAL HIGH

## 2011-02-03 LAB — B-NATRIURETIC PEPTIDE (CONVERTED LAB)
Pro B Natriuretic peptide (BNP): 465 — ABNORMAL HIGH
Pro B Natriuretic peptide (BNP): 583 — ABNORMAL HIGH
Pro B Natriuretic peptide (BNP): 644 — ABNORMAL HIGH
Pro B Natriuretic peptide (BNP): 649 — ABNORMAL HIGH
Pro B Natriuretic peptide (BNP): 667 — ABNORMAL HIGH
Pro B Natriuretic peptide (BNP): 1061 — ABNORMAL HIGH

## 2011-02-03 LAB — I-STAT 8, (EC8 V) (CONVERTED LAB)
BUN: 26 — ABNORMAL HIGH
Chloride: 108
Glucose, Bld: 92
HCT: 38 — ABNORMAL LOW
Operator id: 272551
pCO2, Ven: 50.3 — ABNORMAL HIGH

## 2011-02-03 LAB — COMPREHENSIVE METABOLIC PANEL
AST: 46 — ABNORMAL HIGH
Albumin: 2.4 — ABNORMAL LOW
Calcium: 8.8
Creatinine, Ser: 2.14 — ABNORMAL HIGH
GFR calc Af Amer: 37 — ABNORMAL LOW
Sodium: 141

## 2011-02-03 LAB — PROTEIN ELECTROPH W RFLX QUANT IMMUNOGLOBULINS
Alpha-1-Globulin: 7.3 — ABNORMAL HIGH
Alpha-2-Globulin: 22.5 — ABNORMAL HIGH
Beta 2: 9.2 — ABNORMAL HIGH
Beta Globulin: 6.3
Gamma Globulin: 8.2 — ABNORMAL LOW
M-Spike, %: NOT DETECTED

## 2011-02-03 LAB — BASIC METABOLIC PANEL WITH GFR
BUN: 25 — ABNORMAL HIGH
CO2: 31
Calcium: 8.8
Chloride: 104
Creatinine, Ser: 2.36 — ABNORMAL HIGH
GFR calc non Af Amer: 28 — ABNORMAL LOW
Glucose, Bld: 105 — ABNORMAL HIGH
Potassium: 3.5
Sodium: 144

## 2011-02-03 LAB — PROTIME-INR
INR: 1.1
Prothrombin Time: 14

## 2011-02-03 LAB — PROTEIN, URINE, 24 HOUR
Protein, 24H Urine: 7790 — ABNORMAL HIGH
Urine Total Volume-UPROT: 1900

## 2011-02-03 LAB — URINE MICROSCOPIC-ADD ON

## 2011-02-03 LAB — TSH: TSH: 2.129

## 2011-02-03 LAB — UIFE/LIGHT CHAINS/TP QN, 24-HR UR
Beta, Urine: DETECTED — AB
Free Lambda Lt Chains,Ur: 2.03 — ABNORMAL HIGH (ref 0.08–1.01)
Free Lt Chn Excr Rate: 210.9
Gamma Globulin, Urine: DETECTED — AB
Total Protein, Urine-Ur/day: 5170 — ABNORMAL HIGH (ref 10–140)

## 2011-02-03 LAB — CARDIAC PANEL(CRET KIN+CKTOT+MB+TROPI): CK, MB: 7.4 — ABNORMAL HIGH

## 2011-02-03 LAB — MAGNESIUM: Magnesium: 2

## 2011-02-03 LAB — IMMUNOFIXATION ADD-ON

## 2011-02-03 LAB — LIPID PANEL: Cholesterol: 239 — ABNORMAL HIGH

## 2011-02-03 LAB — C3 COMPLEMENT: C3 Complement: 128

## 2011-02-03 LAB — IRON AND TIBC: Saturation Ratios: 32

## 2011-02-03 LAB — C4 COMPLEMENT: Complement C4, Body Fluid: 24

## 2011-02-03 LAB — APTT: aPTT: 35

## 2011-02-10 ENCOUNTER — Emergency Department (HOSPITAL_COMMUNITY): Payer: Medicare HMO

## 2011-02-10 ENCOUNTER — Emergency Department (HOSPITAL_COMMUNITY)
Admission: EM | Admit: 2011-02-10 | Discharge: 2011-02-10 | Disposition: A | Discharge status: Home / Self Care | Payer: Medicare HMO | Attending: Emergency Medicine | Admitting: Emergency Medicine

## 2011-02-10 DIAGNOSIS — M549 Dorsalgia, unspecified: Secondary | ICD-10-CM | POA: Insufficient documentation

## 2011-02-10 DIAGNOSIS — Z87442 Personal history of urinary calculi: Secondary | ICD-10-CM | POA: Insufficient documentation

## 2011-02-10 DIAGNOSIS — I2510 Atherosclerotic heart disease of native coronary artery without angina pectoris: Secondary | ICD-10-CM | POA: Insufficient documentation

## 2011-02-10 DIAGNOSIS — N390 Urinary tract infection, site not specified: Secondary | ICD-10-CM | POA: Insufficient documentation

## 2011-02-10 DIAGNOSIS — Z7982 Long term (current) use of aspirin: Secondary | ICD-10-CM | POA: Insufficient documentation

## 2011-02-10 DIAGNOSIS — Z794 Long term (current) use of insulin: Secondary | ICD-10-CM | POA: Insufficient documentation

## 2011-02-10 DIAGNOSIS — R209 Unspecified disturbances of skin sensation: Secondary | ICD-10-CM | POA: Insufficient documentation

## 2011-02-10 DIAGNOSIS — I739 Peripheral vascular disease, unspecified: Secondary | ICD-10-CM | POA: Insufficient documentation

## 2011-02-10 DIAGNOSIS — K5730 Diverticulosis of large intestine without perforation or abscess without bleeding: Secondary | ICD-10-CM | POA: Insufficient documentation

## 2011-02-10 DIAGNOSIS — I10 Essential (primary) hypertension: Secondary | ICD-10-CM | POA: Insufficient documentation

## 2011-02-10 DIAGNOSIS — I509 Heart failure, unspecified: Secondary | ICD-10-CM | POA: Insufficient documentation

## 2011-02-10 DIAGNOSIS — Z79899 Other long term (current) drug therapy: Secondary | ICD-10-CM | POA: Insufficient documentation

## 2011-02-10 DIAGNOSIS — E119 Type 2 diabetes mellitus without complications: Secondary | ICD-10-CM | POA: Insufficient documentation

## 2011-02-10 LAB — CBC
HCT: 43.6 % (ref 39.0–52.0)
MCHC: 34.6 g/dL (ref 30.0–36.0)
RDW: 13 % (ref 11.5–15.5)

## 2011-02-10 LAB — URINE MICROSCOPIC-ADD ON

## 2011-02-10 LAB — DIFFERENTIAL
Basophils Absolute: 0 10*3/uL (ref 0.0–0.1)
Eosinophils Relative: 1 % (ref 0–5)
Lymphocytes Relative: 16 % (ref 12–46)
Monocytes Absolute: 0.6 10*3/uL (ref 0.1–1.0)

## 2011-02-10 LAB — POCT I-STAT, CHEM 8
Calcium, Ion: 1.09 mmol/L — ABNORMAL LOW (ref 1.12–1.32)
Glucose, Bld: 148 mg/dL — ABNORMAL HIGH (ref 70–99)
HCT: 47 % (ref 39.0–52.0)
Hemoglobin: 16 g/dL (ref 13.0–17.0)
Potassium: 4.3 mEq/L (ref 3.5–5.1)

## 2011-02-10 LAB — URINALYSIS, ROUTINE W REFLEX MICROSCOPIC
Glucose, UA: NEGATIVE mg/dL
Hgb urine dipstick: NEGATIVE
Specific Gravity, Urine: 1.018 (ref 1.005–1.030)
Urobilinogen, UA: 1 mg/dL (ref 0.0–1.0)
pH: 5 (ref 5.0–8.0)

## 2011-02-11 ENCOUNTER — Inpatient Hospital Stay (HOSPITAL_COMMUNITY)
Admission: EM | Admit: 2011-02-11 | Discharge: 2011-02-13 | DRG: 552 | Disposition: A | Discharge status: Home / Self Care | Payer: Medicare HMO | Attending: Family Medicine | Admitting: Family Medicine

## 2011-02-11 ENCOUNTER — Emergency Department (HOSPITAL_COMMUNITY): Payer: Medicare HMO

## 2011-02-11 DIAGNOSIS — I509 Heart failure, unspecified: Secondary | ICD-10-CM | POA: Diagnosis present

## 2011-02-11 DIAGNOSIS — M5137 Other intervertebral disc degeneration, lumbosacral region: Principal | ICD-10-CM | POA: Diagnosis present

## 2011-02-11 DIAGNOSIS — IMO0002 Reserved for concepts with insufficient information to code with codable children: Secondary | ICD-10-CM | POA: Diagnosis present

## 2011-02-11 DIAGNOSIS — M51379 Other intervertebral disc degeneration, lumbosacral region without mention of lumbar back pain or lower extremity pain: Principal | ICD-10-CM | POA: Diagnosis present

## 2011-02-11 DIAGNOSIS — Z794 Long term (current) use of insulin: Secondary | ICD-10-CM

## 2011-02-11 DIAGNOSIS — I129 Hypertensive chronic kidney disease with stage 1 through stage 4 chronic kidney disease, or unspecified chronic kidney disease: Secondary | ICD-10-CM | POA: Diagnosis present

## 2011-02-11 DIAGNOSIS — J449 Chronic obstructive pulmonary disease, unspecified: Secondary | ICD-10-CM | POA: Diagnosis present

## 2011-02-11 DIAGNOSIS — M47817 Spondylosis without myelopathy or radiculopathy, lumbosacral region: Secondary | ICD-10-CM | POA: Diagnosis present

## 2011-02-11 DIAGNOSIS — I2510 Atherosclerotic heart disease of native coronary artery without angina pectoris: Secondary | ICD-10-CM | POA: Diagnosis present

## 2011-02-11 DIAGNOSIS — IMO0001 Reserved for inherently not codable concepts without codable children: Secondary | ICD-10-CM | POA: Diagnosis present

## 2011-02-11 DIAGNOSIS — R339 Retention of urine, unspecified: Secondary | ICD-10-CM | POA: Diagnosis present

## 2011-02-11 DIAGNOSIS — R269 Unspecified abnormalities of gait and mobility: Secondary | ICD-10-CM | POA: Diagnosis present

## 2011-02-11 DIAGNOSIS — F17200 Nicotine dependence, unspecified, uncomplicated: Secondary | ICD-10-CM | POA: Diagnosis present

## 2011-02-11 DIAGNOSIS — J4489 Other specified chronic obstructive pulmonary disease: Secondary | ICD-10-CM | POA: Diagnosis present

## 2011-02-11 DIAGNOSIS — I2589 Other forms of chronic ischemic heart disease: Secondary | ICD-10-CM | POA: Diagnosis present

## 2011-02-11 DIAGNOSIS — Z79899 Other long term (current) drug therapy: Secondary | ICD-10-CM

## 2011-02-11 DIAGNOSIS — Z7982 Long term (current) use of aspirin: Secondary | ICD-10-CM

## 2011-02-11 DIAGNOSIS — N183 Chronic kidney disease, stage 3 unspecified: Secondary | ICD-10-CM | POA: Diagnosis present

## 2011-02-11 DIAGNOSIS — I5022 Chronic systolic (congestive) heart failure: Secondary | ICD-10-CM | POA: Diagnosis present

## 2011-02-11 LAB — URINALYSIS, ROUTINE W REFLEX MICROSCOPIC
Glucose, UA: NEGATIVE mg/dL
Leukocytes, UA: NEGATIVE
Specific Gravity, Urine: 1.017 (ref 1.005–1.030)
pH: 5.5 (ref 5.0–8.0)

## 2011-02-11 LAB — URINE MICROSCOPIC-ADD ON

## 2011-02-12 ENCOUNTER — Emergency Department (HOSPITAL_COMMUNITY): Payer: Medicare HMO

## 2011-02-12 LAB — GLUCOSE, CAPILLARY
Glucose-Capillary: 240 mg/dL — ABNORMAL HIGH (ref 70–99)
Glucose-Capillary: 218 mg/dL — ABNORMAL HIGH (ref 70–99)
Glucose-Capillary: 209 mg/dL — ABNORMAL HIGH (ref 70–99)

## 2011-02-12 LAB — CBC
MCH: 31.9 pg (ref 26.0–34.0)
Platelets: 228 10*3/uL (ref 150–400)
RBC: 4.89 MIL/uL (ref 4.22–5.81)
RDW: 13.1 % (ref 11.5–15.5)
WBC: 8.2 10*3/uL (ref 4.0–10.5)

## 2011-02-12 LAB — BASIC METABOLIC PANEL
CO2: 23 mEq/L (ref 19–32)
Calcium: 9.7 mg/dL (ref 8.4–10.5)
Creatinine, Ser: 2.53 mg/dL — ABNORMAL HIGH (ref 0.50–1.35)
GFR calc Af Amer: 28 mL/min — ABNORMAL LOW (ref >90)
GFR calc non Af Amer: 24 mL/min — ABNORMAL LOW (ref >90)
Sodium: 134 mEq/L — ABNORMAL LOW (ref 135–145)

## 2011-02-12 LAB — URINE CULTURE
Colony Count: NO GROWTH
Culture  Setup Time: 201210180217
Culture: NO GROWTH

## 2011-02-12 LAB — MAGNESIUM: Magnesium: 2.4 mg/dL (ref 1.5–2.5)

## 2011-02-12 NOTE — H&P (Signed)
NAMEARSHAN, JABS NO.:  [id number]  MEDICAL RECORD NO.:  [id number]  LOCATION:  WLED                         FACILITY:  [facility]  PHYSICIAN:  Vania Rea, M.D. DATE OF BIRTH:  1940/06/12  DATE OF ADMISSION:  02/11/2011 DATE OF DISCHARGE:                             HISTORY & PHYSICAL   PRIMARY CARE PHYSICIAN:  Lucky Cowboy, MD  CHIEF COMPLAINT:  Progressively worsening back pain and inability to walk for the past 3 weeks.  HISTORY OF PRESENT ILLNESS:  This is a 70 year old Caucasian gentleman with a history of COPD, ischemic cardiomyopathy, coronary artery disease, diabetes and hypertension, who has been having progressively worsening back pain for the past 3 weeks.  The pain is becomes significantly more severe over the past 3 days, and in fact he came to the emergency room yesterday where he was treated with pain medications and medications for Bactrim for possible urinary tract infection, but pain medications was unable to control his pain at home and the patient returns today for further evaluation.  The patient reports the pain is sharp in his lower back, initially radiated down to his right hip but is now radiating down to both knees and he is cannot walk unsupported.  He feels better sitting up rather than lying down.  He has been having no fever, cough, or cold.  No chest pains or shortness of breath.  No nausea or vomiting.  He does not use home oxygen.  He has never actually been diagnosed with COPD.  He is a former smoker.  PAST MEDICAL HISTORY: 1. Diabetes. 2. Hypertension. 3. Coronary artery disease. 4. Systolic heart failure. 5. Chronic kidney disease stage 3.  MEDICATIONS: 1. Aspirin 81 mg daily. 2. NitroQuick when needed. 3. BiDil 3 times daily. 4. Carvedilol 12.5 mg twice daily. 5. Clonidine 0.2 mg twice daily. 6. Vitamin D 6000 units daily. 7. Furosemide 40 mg daily and extra tablet when needed. 8. Lantus 39 units each  morning. 9. Losartan 100 mg daily. 10.Flax seeds 1000 mg 3 times daily. 11.Fish oil 1000 mg 3 times daily. 12.Vitamin B12 one tablet daily.  ALLERGIES:  To PENICILLIN.  SOCIAL HISTORY:  Used to smoke 2-4 packs per day for many years, but has now cut down to 2-3 cigars per day.  Denies alcohol or illicit drug use. Has had multiple general laboring driving jobs.  FAMILY HISTORY:  Significant for diabetes and coronary artery disease in both parents.  REVIEW OF SYSTEMS:  Other than noted above, unremarkable.  PHYSICAL EXAMINATION:  GENERAL:  Obese, elderly Caucasian gentleman sitting up in the stretcher,  looks younger than his stated age, but he is distressed by pain. VITAL SIGNS:  Temperature is 98.4, pulse 80, respirations 20, blood pressure 157/103, saturating at 98% on 2 L.  His pupils are round and equal.  Mucous membranes pink.  Anicteric.  No cervical lymphadenopathy or thyromegaly.  No carotid bruit.  No jugular venous distention. CHEST:  Clear to auscultation bilaterally. CARDIOVASCULAR SYSTEM:  Regular rhythm.  No murmurs. ABDOMEN:  Obese and soft. EXTREMITIES:  No edema.  He has arthritic changes of the knees and ankles.  MUSCULOSKELETAL:  Back, he is tender  from the level of about T12-L3 with left paraspinal tenderness. SKIN:  The skin of his low back is somewhat erythematous, but blanches easily and there is no inflammatory rash or ulceration of the skin. CENTRAL NERVOUS SYSTEM:  Cranial nerves II through XII are grossly intact.  He has no focal lateralizing signs.  LABORATORY DATA:  Labs were done the day prior to admission.  His CBC was unremarkable with a white count of 9.8, hemoglobin 15, platelets 204.  His serum chemistry showed a BUN of 67 and a creatinine of 3.5, glucose was 148, otherwise unremarkable.  Urinalysis was not suggestive of infection, more suggestive of contamination.  His proteins are greater than 300.  X-rays of his spine today shows  spondylosis.  CT scan of the abdomen and pelvis yesterday showed a sigmoid diverticulosis without diverticulitis, cholelithiasis, extensive atherosclerosis and enlargement of the prostate.  No acute intra-abdominal or pelvic process.  ASSESSMENT: 1. Lumbar radiculopathy, probably lumbar disk disease. 2. Intractable pain. 3. Hypertension, uncontrolled. 4. Chronic kidney disease. 5. Systolic heart failure. 6. Hypertension, uncontrolled, probably related to pain. 7. Diabetes type 2.  PLAN: 1. We will admit this gentleman for pain control and we will schedule     him to have an MRI of the lumbar spine     and further management depending on the results of the MRI.  We     will continue his antihypertensives.  We will continue his Lantus     at a reduced dose with sliding scale.  We will ensure adequate pain     control. 2. Other plans as per orders.     Vania Rea, M.D.     LC/MEDQ  D:  02/12/2011  T:  02/12/2011  Job:  [id number]  cc:   Lucky Cowboy, M.D. Fax: [phone]

## 2011-02-13 LAB — GLUCOSE, CAPILLARY
Glucose-Capillary: 169 mg/dL — ABNORMAL HIGH (ref 70–99)
Glucose-Capillary: 180 mg/dL — ABNORMAL HIGH (ref 70–99)

## 2011-02-13 LAB — VITAMIN B12: Vitamin B-12: 1120 pg/mL — ABNORMAL HIGH (ref 211–911)

## 2011-02-13 LAB — COMPREHENSIVE METABOLIC PANEL
AST: 21 U/L (ref 0–37)
Albumin: 3.1 g/dL — ABNORMAL LOW (ref 3.5–5.2)
BUN: 57 mg/dL — ABNORMAL HIGH (ref 6–23)
Calcium: 8.9 mg/dL (ref 8.4–10.5)
Chloride: 100 mEq/L (ref 96–112)
Creatinine, Ser: 2.85 mg/dL — ABNORMAL HIGH (ref 0.50–1.35)
GFR calc non Af Amer: 21 mL/min — ABNORMAL LOW (ref >90)
Total Bilirubin: 0.5 mg/dL (ref 0.3–1.2)

## 2011-02-13 LAB — CBC
HCT: 38.9 % — ABNORMAL LOW (ref 39.0–52.0)
MCH: 31.3 pg (ref 26.0–34.0)
MCV: 92.8 fL (ref 78.0–100.0)
Platelets: 180 10*3/uL (ref 150–400)
RDW: 13.1 % (ref 11.5–15.5)
WBC: 7.8 10*3/uL (ref 4.0–10.5)

## 2011-02-13 LAB — TSH: TSH: 0.437 u[IU]/mL (ref 0.350–4.500)

## 2011-02-13 LAB — MAGNESIUM: Magnesium: 2.4 mg/dL (ref 1.5–2.5)

## 2011-02-20 NOTE — Discharge Summary (Signed)
[name], GUMZ NO.:  [id number]  MEDICAL RECORD NO.:  [id number]  LOCATION:  1519                         FACILITY:  [facility]  PHYSICIAN:  Brendia Sacks, MD    DATE OF BIRTH:  1941-02-20  DATE OF ADMISSION:  02/11/2011 DATE OF DISCHARGE:  02/13/2011                              DISCHARGE SUMMARY   PRIMARY CARE PHYSICIAN:  Lucky Cowboy, MD  PRIMARY CARDIOLOGIST:  Lyn Records, MD  PRIMARY NEPHROLOGIST:  Dyke Maes, MD  CONDITION ON DISCHARGE:  Improved.  DISPOSITION:  Home.  The patient refused physical therapy.  DISCHARGE DIAGNOSES: 1. Low back pain secondary to disk disease and peripheral nerve root     impingement.  Strength preserved. 2. Diabetes mellitus type 2, uncontrolled by hemoglobin A1c, but     stable as an inpatient. 3. Chronic kidney disease, stage 3 to stage 4, stable. 4. History of systolic congestive heart failure, stable.  HISTORY OF PRESENT ILLNESS:  [name] is a 70 year old man presenting with progressive back pain over the last 3 weeks and worse approximately 3 days prior to admission.  Initially, the low back pain started in his low back and then radiated to his right hip and to both knees.  Normally he walks 5 miles a day, but over the last 4 weeks, he has had difficulty ambulating.  He has no weakness in his legs.  No new numbness or paresthesias.  No bowel or bladder incontinence.  No sacral dysesthesia. He was admitted for further evaluation and treatment.  HOSPITAL COURSE:  [name] was admitted to medical floor, treated with analgesics, and further evaluated with an MRI of the lumbar spine.  This did reveal multilevel disk disease with some peripheral nerve root impingement.  The patient has intact leg strength.  Sensory exam is unremarkable.  Bowel and bladder function is intact.  He was seen by Physical Therapy this morning and stated that he had been up to walk to the bathroom and he declined  any physical therapy.  He thinks he can manage independently at home.  Again, the patient reports to me that he does not desire physical therapy.  He thinks he can manage at home.  He would like to be discharged.  I discussed the case briefly with Dr. Newell Coral and reviewed MRI results with him.  Dr. Newell Coral concurs that there is no need for surgical intervention at this time.  He recommends either nonsteroid antiinflammatories or a steroid dose pack in an attempt to conservative management.  The patient should follow up with his primary care physician in the next week or 2, will complete a steroid taper and he has been given a prescription for some oral narcotics.  If his back pain does not improve or if he develops neurologic symptoms, he should be reevaluated.  Certainly, if his condition fails to improve, outpatient evaluation by a neurosurgeon in the next 4 to 6 weeks would be recommended.  Certainly, if the patient becomes open to physical therapy in the future, this would also be recommended.  Of note, the patient has chronic kidney disease and therefore will not be placed on NSAIDs.  He does  have diabetes, but this appears to be pretty well controlled during this hospitalization and he will be on a tapering dose of steroids fairly rapidly.  He will need to monitor his blood sugars well on those, but this appears to be the best course of treatment for him, given his comorbidities.  CONSULTATIONS:  None.  PROCEDURES:  None.  IMAGING: 1. CT of the abdomen and pelvis, October 16th:  Sigmoid diverticulosis     without evidence of acute diverticulitis.  Left inguinal hernia     containing a nonobstructed segment of the sigmoid colon.     Cholelithiasis.  Extensive atherosclerotic disease.  Enlargement of     the prostate gland.  No definite acute intraabdominal or     intrapelvic process identified. 2. Plain film of the lumbar spine, October 17th:  No acute findings.      Spondylosis. 3. MRI of the lumbar spine, October 18th:  Lumbar spondylosis and     degenerative disk disease noted with a mild impingement at L3-4 and     L4-5.  Small layering gallstones in the gallbladder.  MICROBIOLOGY: 1. Urine culture, October 17th, no growth, final. 2. Urine culture, October 16th, no growth, final.  PERTINENT LABORATORY STUDIES: 1. Hemoglobin A1c is 7.9. 2. TSH within normal limits. 3. CBC unremarkable. 4. Capillary blood sugars are controlled. 5. Basic metabolic panel notable for BUN of 57 and creatinine 2.85     which appears to be at his baseline. 6. Hepatic function panel unremarkable.  PHYSICAL EXAMINATION ON DISCHARGE:  As documented in the progress notes. Of note, he has no focal neurologic deficits.  His strength in bilateral lower extremities is 5/5 and appears to be intact.  Sensation is grossly intact.  Again, he denies any sacral dysesthesia, bowel or bladder incontinence.  DISCHARGE INSTRUCTIONS:  The patient will be discharged home today.  He has refused physical therapy.  Increase activity slowly.  Continue diabetic low-salt diet.  Followup with Dr. Lucky Cowboy in 1 to 2 weeks.  NEW DISCHARGE MEDICATIONS: 1. Dulcolax suppository 10 mg daily as needed for constipation. 2. Colace 100 mg p.o. b.i.d. 3. MiraLax 17 g p.o. daily as needed for constipation. 4. Prednisone 40 mg p.o. daily x3 days, then 30 mg p.o. daily x3 days,     then 20 mg p.o. daily x3 days, then 10 mg p.o. daily x3 days, then     stop.  Monitor blood sugar while on this medication. 5. Oxycodone/acetaminophen 5/325 one tablet every 6 hours as needed     for pain, #45, no refills. Resume the following medications; 1. Aspirin 81 mg p.o. daily. 2. BiDil 20/37.5 p.o. t.i.d. 3. Carvedilol 12.5 mg p.o. b.i.d. 4. Clonidine 0.2 mg p.o. b.i.d. 5. Diazepam 5 mg every 4 hours as needed for muscle spasm. 6. Flaxseed oil p.o. daily. 7. Lasix 40 mg p.o. daily. 8. Lantus 39 units  subcu daily. 9. Losartan 100 mg p.o. daily. 10.Omega-3 acid ethyl esters 1 g p.o. daily. 11.Vitamin B12 p.o. daily. 12.Vitamin D 6000 units p.o. daily. Discontinue the following medication, Bactrim.  TIME COORDINATING DISCHARGE:  50 minutes.     Brendia Sacks, MD     DG/MEDQ  D:  02/13/2011  T:  02/13/2011  Job:  [id number]  cc:   Lucky Cowboy, M.D. Fax: [phone]  Lyn Records, M.D. Fax: [phone]  Dyke Maes, M.D. Fax: [phone]  Electronically Signed by Brendia Sacks  on 02/20/2011 02:45:02 PM

## 2011-02-23 NOTE — H&P (Signed)
NAMEJAHMEIR, [name] NO.:  [id number]  MEDICAL RECORD NO.:  [id number]  LOCATION:  1519                         FACILITY:  [facility]  PHYSICIAN:  Vania Rea, M.D. DATE OF BIRTH:  1940/07/11  DATE OF ADMISSION:  02/11/2011 DATE OF DISCHARGE:  02/13/2011                             HISTORY & PHYSICAL   PRIMARY CARE PHYSICIAN:  Lucky Cowboy, MD  CHIEF COMPLAINT:  Progressively worsening back pain and inability to walk for the past 3 weeks.  HISTORY OF PRESENT ILLNESS:  This is a 70 year old Caucasian gentleman with a history of COPD, ischemic cardiomyopathy, coronary artery disease, diabetes and hypertension, who has been having progressively worsening back pain for the past 3 weeks.  The pain is becomes significantly more severe over the past 3 days, and in fact he came to the emergency room yesterday where he was treated with pain medications and medications for Bactrim for possible urinary tract infection, but pain medications was unable to control his pain at home and the patient returns today for further evaluation.  The patient reports the pain is sharp in his lower back, initially radiated down to his right hip but is now radiating down to both knees and he is cannot walk unsupported.  He feels better sitting up rather than lying down.  He has been having no fever, cough, or cold.  No chest pains or shortness of breath.  No nausea or vomiting.  He does not use home oxygen.  He has never actually been diagnosed with COPD.  He is a former smoker.  PAST MEDICAL HISTORY: 1. Diabetes. 2. Hypertension. 3. Coronary artery disease. 4. Systolic heart failure. 5. Chronic kidney disease stage 3.  MEDICATIONS: 1. Aspirin 81 mg daily. 2. NitroQuick when needed. 3. BiDil 3 times daily. 4. Carvedilol 12.5 mg twice daily. 5. Clonidine 0.2 mg twice daily. 6. Vitamin D 6000 units daily. 7. Furosemide 40 mg daily and extra tablet when needed. 8. Lantus 39  units each morning. 9. Losartan 100 mg daily. 10.Flax seeds 1000 mg 3 times daily. 11.Fish oil 1000 mg 3 times daily. 12.Vitamin B12 one tablet daily.  ALLERGIES:  To PENICILLIN.  SOCIAL HISTORY:  Used to smoke 2-4 packs per day for many years, but has now cut down to 2-3 cigars per day.  Denies alcohol or illicit drug use. Has had multiple general laboring driving jobs.  FAMILY HISTORY:  Significant for diabetes and coronary artery disease in both parents.  REVIEW OF SYSTEMS:  Other than noted above, unremarkable.  PHYSICAL EXAMINATION:  GENERAL:  Obese, elderly Caucasian gentleman sitting up in the stretcher,  looks younger than his stated age, but he is distressed by pain. VITAL SIGNS:  Temperature is 98.4, pulse 80, respirations 20, blood pressure 157/103, saturating at 98% on 2 L.  His pupils are round and equal.  Mucous membranes pink.  Anicteric.  No cervical lymphadenopathy or thyromegaly.  No carotid bruit.  No jugular venous distention. CHEST:  Clear to auscultation bilaterally. CARDIOVASCULAR SYSTEM:  Regular rhythm.  No murmurs. ABDOMEN:  Obese and soft. EXTREMITIES:  No edema.  He has arthritic changes of the knees and ankles.  MUSCULOSKELETAL:  Back, he  is tender from the level of about T12-L3 with left paraspinal tenderness. SKIN:  The skin of his low back is somewhat erythematous, but blanches easily and there is no inflammatory rash or ulceration of the skin. CENTRAL NERVOUS SYSTEM:  Cranial nerves II through XII are grossly intact.  He has no focal lateralizing signs.  LABORATORY DATA:  Labs were done the day prior to admission.  His CBC was unremarkable with a white count of 9.8, hemoglobin 15, platelets 204.  His serum chemistry showed a BUN of 67 and a creatinine of 3.5, glucose was 148, otherwise unremarkable.  Urinalysis was not suggestive of infection, more suggestive of contamination.  His proteins are greater than 300.  X-rays of his spine today  shows spondylosis.  CT scan of the abdomen and pelvis yesterday showed a sigmoid diverticulosis without diverticulitis, cholelithiasis, extensive atherosclerosis and enlargement of the prostate.  No acute intra-abdominal or pelvic process.  ASSESSMENT: 1. Lumbar radiculopathy, probably lumbar disk disease. 2. Intractable pain. 3. Hypertension, uncontrolled. 4. Chronic kidney disease. 5. Systolic heart failure. 6. Hypertension, uncontrolled, probably related to pain. 7. Diabetes type 2.  PLAN: 1. We will admit this gentleman for pain control and we will schedule     him to have an MRI of the lumbar spine     and further management depending on the results of the MRI.  We     will continue his antihypertensives.  We will continue his Lantus     at a reduced dose with sliding scale.  We will ensure adequate pain     control. 2. Other plans as per orders.     Vania Rea, M.D.     LC/MEDQ  D:  02/12/2011  T:  02/23/2011  Job:  [id number]  Electronically Signed by Vania Rea M.D. on 02/23/2011 11:58:51 PM

## 2013-03-29 ENCOUNTER — Encounter: Payer: Self-pay | Admitting: Physician Assistant

## 2013-03-29 DIAGNOSIS — E669 Obesity, unspecified: Secondary | ICD-10-CM | POA: Insufficient documentation

## 2013-03-29 DIAGNOSIS — I509 Heart failure, unspecified: Secondary | ICD-10-CM | POA: Insufficient documentation

## 2013-03-29 DIAGNOSIS — I10 Essential (primary) hypertension: Secondary | ICD-10-CM

## 2013-03-29 DIAGNOSIS — E1029 Type 1 diabetes mellitus with other diabetic kidney complication: Secondary | ICD-10-CM | POA: Insufficient documentation

## 2013-03-29 DIAGNOSIS — E1129 Type 2 diabetes mellitus with other diabetic kidney complication: Secondary | ICD-10-CM

## 2013-03-29 DIAGNOSIS — E559 Vitamin D deficiency, unspecified: Secondary | ICD-10-CM

## 2013-03-29 DIAGNOSIS — E785 Hyperlipidemia, unspecified: Secondary | ICD-10-CM | POA: Insufficient documentation

## 2013-03-31 ENCOUNTER — Ambulatory Visit (INDEPENDENT_AMBULATORY_CARE_PROVIDER_SITE_OTHER): Payer: Medicare HMO | Admitting: Physician Assistant

## 2013-03-31 ENCOUNTER — Encounter: Payer: Self-pay | Admitting: Physician Assistant

## 2013-03-31 VITALS — BP 138/78 | HR 72 | Temp 97.9°F | Resp 16 | Ht 67.0 in | Wt 205.0 lb

## 2013-03-31 DIAGNOSIS — E1129 Type 2 diabetes mellitus with other diabetic kidney complication: Secondary | ICD-10-CM

## 2013-03-31 DIAGNOSIS — E782 Mixed hyperlipidemia: Secondary | ICD-10-CM

## 2013-03-31 DIAGNOSIS — E785 Hyperlipidemia, unspecified: Secondary | ICD-10-CM

## 2013-03-31 DIAGNOSIS — E559 Vitamin D deficiency, unspecified: Secondary | ICD-10-CM

## 2013-03-31 DIAGNOSIS — I10 Essential (primary) hypertension: Secondary | ICD-10-CM

## 2013-03-31 LAB — CBC WITH DIFFERENTIAL/PLATELET
Basophils Relative: 0 % (ref 0–1)
Eosinophils Absolute: 0.2 10*3/uL (ref 0.0–0.7)
MCH: 33.3 pg (ref 26.0–34.0)
MCHC: 34.8 g/dL (ref 30.0–36.0)
Neutro Abs: 7.1 10*3/uL (ref 1.7–7.7)
Neutrophils Relative %: 72 % (ref 43–77)
Platelets: 162 10*3/uL (ref 150–400)
RBC: 4.59 MIL/uL (ref 4.22–5.81)

## 2013-03-31 LAB — LIPID PANEL
HDL: 25 mg/dL — ABNORMAL LOW (ref >39)
Total CHOL/HDL Ratio: 6.9 Ratio
Triglycerides: 460 mg/dL — ABNORMAL HIGH (ref <150)

## 2013-03-31 LAB — TSH: TSH: 2.723 u[IU]/mL (ref 0.350–4.500)

## 2013-03-31 LAB — HEPATIC FUNCTION PANEL
AST: 19 U/L (ref 0–37)
Albumin: 3.9 g/dL (ref 3.5–5.2)
Alkaline Phosphatase: 73 U/L (ref 39–117)
Indirect Bilirubin: 0.5 mg/dL (ref 0.0–0.9)
Total Protein: 6.7 g/dL (ref 6.0–8.3)

## 2013-03-31 LAB — BASIC METABOLIC PANEL WITH GFR
BUN: 57 mg/dL — ABNORMAL HIGH (ref 6–23)
CO2: 27 mEq/L (ref 19–32)
Chloride: 103 mEq/L (ref 96–112)
Creat: 3.42 mg/dL — ABNORMAL HIGH (ref 0.50–1.35)
GFR, Est Non African American: 17 mL/min — ABNORMAL LOW
Potassium: 4.3 mEq/L (ref 3.5–5.3)
Sodium: 140 mEq/L (ref 135–145)

## 2013-03-31 LAB — HEMOGLOBIN A1C: Hgb A1c MFr Bld: 6.3 % — ABNORMAL HIGH (ref <5.7)

## 2013-03-31 MED ORDER — ALLOPURINOL 300 MG PO TABS: 300.0000 mg | ORAL_TABLET | Freq: Every day | ORAL | Status: DC

## 2013-03-31 NOTE — Progress Notes (Signed)
HPI Patient presents for 3 month follow up with hypertension, hyperlipidemia, prediabetes and vitamin D. Patient's blood pressure has been controlled at home. Patient denies chest pain, shortness of breath, dizziness.  Patient's cholesterol is diet controlled. The cholesterol last visit was LDL 74, trig 356, HDL 28. He does not check is sugar at home, he has increased his lantus to 45 units daily and states he has been eating poorly due to the holidays. Denies changes in vision, polys, and paresthesias. A1C 6.1 Patient has CKD due to his DM and refuses dialysis.  Patient has CHF and has gained 4 lbs from sept. He denies orthopnea, PND, edema, and SOB is unchanged.  Patient is on Allopurinol for gout and states he has not had any flares. He states his legs feel much better and he has not been using his Norco.  Patient is on Vitamin D supplement. Vit D 85 Current Medications:  Current Outpatient Prescriptions on File Prior to Visit  Medication Sig Dispense Refill  . aspirin 81 MG tablet Take 81 mg by mouth daily.      . carvedilol (COREG) 12.5 MG tablet Take 12.5 mg by mouth 2 (two) times daily with a meal.      . cloNIDine (CATAPRES) 0.2 MG tablet Take 0.2 mg by mouth 2 (two) times daily.      . Flaxseed, Linseed, (FLAX SEED OIL PO) Take by mouth.      . furosemide (LASIX) 40 MG tablet Take 40 mg by mouth.      . insulin glargine (LANTUS) 100 UNIT/ML injection Inject 45 Units into the skin at bedtime.       . isosorbide-hydrALAZINE (BIDIL) 20-37.5 MG per tablet Take by mouth 3 (three) times daily.      . Omega-3 Fatty Acids (FISH OIL) 1000 MG CAPS Take by mouth.      . vitamin B-12 (CYANOCOBALAMIN) 1000 MCG tablet Take 1,000 mcg by mouth daily.       No current facility-administered medications on file prior to visit.   Medical History:  Past Medical History  Diagnosis Date  . Hypertension   . Hyperlipidemia   . Diabetes mellitus with renal complications   . CHF (congestive heart failure)    . DVT (deep venous thrombosis)     history  . Vitamin D deficiency   . Obesity   . Nephrolithiasis   . BPH (benign prostatic hyperplasia)   . Gout    Allergies:  Allergies  Allergen Reactions  . Ace Inhibitors   . Citalopram   . Penicillins     ROS Constitutional: Denies fever, chills, weight loss/gain, headaches, insomnia, fatigue, night sweats, and change in appetite. Eyes: Denies redness, blurred vision, diplopia, discharge, itchy, watery eyes.  ENT: Denies discharge, congestion, post nasal drip, sore throat, earache, dental pain, Tinnitus, Vertigo, Sinus pain, snoring.  Cardio: Denies chest pain, palpitations, irregular heartbeat,  dyspnea, diaphoresis, orthopnea, PND, claudication, edema Respiratory: denies cough, dyspnea,pleurisy, hoarseness, wheezing.  Gastrointestinal: Denies dysphagia, heartburn,  water brash, pain, cramps, nausea, vomiting, bloating, diarrhea, constipation, hematemesis, melena, hematochezia,  hemorrhoids Genitourinary: Denies dysuria, frequency, urgency, nocturia, hesitancy, discharge, hematuria, flank pain Musculoskeletal: Denies arthralgia, myalgia, stiffness, Jt. Swelling, pain, limp, and strain/sprain. Skin: Denies pruritis, rash, hives, warts, acne, eczema, changing in skin lesion Neuro: Denies Weakness, tremor, incoordination, spasms, paresthesia, pain Psychiatric: Denies confusion, memory loss, sensory loss Endocrine: Denies change in weight, skin, hair change, nocturia, and paresthesia, Diabetic Polys, Denies visual blurring, hyper /hypo glycemic episodes.  Heme/Lymph: Denies Excessive  bleeding, bruising, enlarged lymph nodes  Family history- Review and unchanged Social history- Review and unchanged Physical Exam: Filed Vitals:   03/31/13 0905  BP: 138/78  Pulse: 72  Temp: 97.9 F (36.6 C)  Resp: 16   Filed Weights   03/31/13 0905  Weight: 205 lb (92.987 kg)   General Appearance: Well nourished, in no apparent distress. Eyes: PERRLA,  EOMs, conjunctiva no swelling or erythema, normal fundi and vessels. Sinuses: No Frontal/maxillary tenderness ENT/Mouth: Ext aud canals clear, with TMs without erythema, bulging.No erythema, swelling, or exudate on post pharynx.  Tonsils not swollen or erythematous. Hearing normal.  Neck: Supple, thyroid normal, neg JVD Respiratory: Respiratory effort normal, LLQ rhonci, 98% RA.  Cardio: Heart sounds normal, regular rate and rhythm with 2-3/6 systolic murmur without rubs or gallops.  Abdomen: Obese, soft, with bowel sounds. Non tender, no guarding, rebound, hernias, masses, or organomegaly.  Lymphatics: Non tender without lymphadenopathy.  Musculoskeletal: Full ROM all peripheral extremities, joint stability, 5/5 strength, and normal gait. Skin: Warm, dry without rashes, lesions, ecchymosis.  Neuro: Cranial nerves intact, reflexes equal bilaterally. Normal muscle tone, no cerebellar symptoms.  Psych: Awake and oriented X 3, normal affect, Insight and Judgment appropriate.   Assessment and Plan:  Hypertension: Continue medication, monitor blood pressure at home. Continue DASH diet. Cholesterol: Continue diet and exercise. Check cholesterol.  Diabetes with CKD-Continue diet and exercise. Check A1C and BMP Vitamin D Def- check level and continue medications.  Gout refill allopurinol  Quentin Mulling 9:15 AM

## 2013-03-31 NOTE — Patient Instructions (Signed)
Heart Failure Heart failure means your heart has trouble pumping blood. This makes it hard for your body to work well. Heart failure is usually a long-term (chronic) condition. You must take good care of yourself and follow your doctor's treatment plan. HOME CARE  Take your heart medicine as told by your doctor.  Do not stop taking medicine unless your doctor tells you to.  Do not skip any dose of medicine.  Refill your medicines before they run out.  Take other medicines only as told by your doctor or pharmacist.  Stay active if told by your doctor. The elderly and people with severe heart failure should talk with a doctor about physical activity.  Eat heart healthy foods. Choose foods that are without trans fat and are low in saturated fat, cholesterol, and salt (sodium). This includes fresh or frozen fruits and vegetables, fish, lean meats, fat-free or low-fat dairy foods, whole grains, and high-fiber foods. Lentils and dried peas and beans (legumes) are also good choices.  Limit salt if told by your doctor.  Cook in a healthy way. Roast, grill, broil, bake, poach, steam, or stir-fry foods.  Limit fluids as told by your doctor.  Weigh yourself every morning. Do this after you pee (urinate) and before you eat breakfast. Write down your weight to give to your doctor.  Take your blood pressure and write it down if your doctor tell you to.  Ask your doctor how to check your pulse. Check your pulse as told.  Lose weight if told by your doctor.  Stop smoking or chewing tobacco. Do not use gum or patches that help you quit without your doctor's approval.  Schedule and go to doctor visits as told.  Nonpregnant women should have no more than 1 drink a day. Men should have no more than 2 drinks a day. Talk to your doctor about drinking alcohol.  Stop illegal drug use.  Stay current with shots (immunizations).  Manage your health conditions as told by your doctor.  Learn to manage  your stress.  Rest when you are tired.  If it is really hot outside:  Avoid intense activities.  Use air conditioning or fans, or get in a cooler place.  Avoid caffeine and alcohol.  Wear loose-fitting, lightweight, and light-colored clothing.  If it is really cold outside:  Avoid intense activities.  Layer your clothing.  Wear mittens or gloves, a hat, and a scarf when going outside.  Avoid alcohol.  Learn about heart failure and get support as needed.  Get help to maintain or improve your quality of life and your ability to care for yourself as needed. GET HELP IF:   You gain 03 lb/1.4 kg or more in 1 day or 05 lb/2.3 kg in a week.  You are more short of breath than usual.  You cannot do your normal activities.  You tire easily.  You cough more than normal, especially with activity.  You have any or more puffiness (swelling) in areas such as your hands, feet, ankles, or belly (abdomen).  You cannot sleep because it is hard to breathe.  You feel like your heart is beating fast (palpitations).  You get dizzy or lightheaded when you stand up. GET HELP RIGHT AWAY IF:   You have trouble breathing.  There is a change in mental status, such as becoming less alert or not being able to focus.  You have chest pain or discomfort.  You faint. MAKE SURE YOU:   Understand these   instructions.  Will watch your condition.  Will get help right away if you are not doing well or get worse. Document Released: 01/21/2008 Document Revised: 08/08/2012 Document Reviewed: 11/12/2011 [employer] Patient Information 2014 [employer].  

## 2013-04-01 LAB — INSULIN, FASTING: Insulin fasting, serum: 36 u[IU]/mL — ABNORMAL HIGH (ref 3–28)

## 2013-05-23 ENCOUNTER — Ambulatory Visit: Payer: Medicare HMO | Admitting: Interventional Cardiology

## 2013-06-28 ENCOUNTER — Encounter: Payer: Self-pay | Admitting: Internal Medicine

## 2013-06-28 ENCOUNTER — Ambulatory Visit (INDEPENDENT_AMBULATORY_CARE_PROVIDER_SITE_OTHER): Payer: Medicare HMO | Admitting: Internal Medicine

## 2013-06-28 VITALS — BP 112/66 | HR 76 | Temp 97.9°F | Resp 16 | Wt 211.0 lb

## 2013-06-28 DIAGNOSIS — E559 Vitamin D deficiency, unspecified: Secondary | ICD-10-CM

## 2013-06-28 DIAGNOSIS — I10 Essential (primary) hypertension: Secondary | ICD-10-CM

## 2013-06-28 DIAGNOSIS — E785 Hyperlipidemia, unspecified: Secondary | ICD-10-CM

## 2013-06-28 DIAGNOSIS — Z79899 Other long term (current) drug therapy: Secondary | ICD-10-CM

## 2013-06-28 DIAGNOSIS — E1129 Type 2 diabetes mellitus with other diabetic kidney complication: Secondary | ICD-10-CM

## 2013-06-28 LAB — BASIC METABOLIC PANEL WITH GFR
BUN: 67 mg/dL — ABNORMAL HIGH (ref 6–23)
CO2: 22 meq/L (ref 19–32)
Calcium: 8.6 mg/dL (ref 8.4–10.5)
Chloride: 104 mEq/L (ref 96–112)
Creat: 3.74 mg/dL — ABNORMAL HIGH (ref 0.50–1.35)
GFR, EST NON AFRICAN AMERICAN: 15 mL/min — AB
GFR, Est African American: 17 mL/min — ABNORMAL LOW
Glucose, Bld: 170 mg/dL — ABNORMAL HIGH (ref 70–99)
POTASSIUM: 4.1 meq/L (ref 3.5–5.3)
Sodium: 142 mEq/L (ref 135–145)

## 2013-06-28 LAB — CBC WITH DIFFERENTIAL/PLATELET
BASOS ABS: 0 10*3/uL (ref 0.0–0.1)
Basophils Relative: 0 % (ref 0–1)
Eosinophils Absolute: 0.3 10*3/uL (ref 0.0–0.7)
Eosinophils Relative: 2 % (ref 0–5)
HCT: 41.4 % (ref 39.0–52.0)
Hemoglobin: 13.9 g/dL (ref 13.0–17.0)
LYMPHS ABS: 1.8 10*3/uL (ref 0.7–4.0)
LYMPHS PCT: 14 % (ref 12–46)
MCH: 31.8 pg (ref 26.0–34.0)
MCHC: 33.6 g/dL (ref 30.0–36.0)
MCV: 94.7 fL (ref 78.0–100.0)
Monocytes Absolute: 0.5 10*3/uL (ref 0.1–1.0)
Monocytes Relative: 4 % (ref 3–12)
NEUTROS ABS: 10.5 10*3/uL — AB (ref 1.7–7.7)
Neutrophils Relative %: 80 % — ABNORMAL HIGH (ref 43–77)
PLATELETS: 158 10*3/uL (ref 150–400)
RBC: 4.37 MIL/uL (ref 4.22–5.81)
RDW: 14.8 % (ref 11.5–15.5)
WBC: 13.1 10*3/uL — AB (ref 4.0–10.5)

## 2013-06-28 LAB — LIPID PANEL
Cholesterol: 182 mg/dL (ref 0–200)
HDL: 28 mg/dL — AB (ref >39)
LDL CALC: 80 mg/dL (ref 0–99)
Total CHOL/HDL Ratio: 6.5 Ratio
Triglycerides: 371 mg/dL — ABNORMAL HIGH (ref <150)
VLDL: 74 mg/dL — ABNORMAL HIGH (ref 0–40)

## 2013-06-28 LAB — HEPATIC FUNCTION PANEL
ALK PHOS: 71 U/L (ref 39–117)
ALT: 12 U/L (ref 0–53)
AST: 18 U/L (ref 0–37)
Albumin: 3.5 g/dL (ref 3.5–5.2)
BILIRUBIN DIRECT: 0.2 mg/dL (ref 0.0–0.3)
BILIRUBIN INDIRECT: 0.4 mg/dL (ref 0.2–1.2)
BILIRUBIN TOTAL: 0.6 mg/dL (ref 0.2–1.2)
Total Protein: 6.3 g/dL (ref 6.0–8.3)

## 2013-06-28 LAB — HEMOGLOBIN A1C
HEMOGLOBIN A1C: 6.9 % — AB (ref <5.7)
Mean Plasma Glucose: 151 mg/dL — ABNORMAL HIGH (ref <117)

## 2013-06-28 LAB — MAGNESIUM: MAGNESIUM: 2.1 mg/dL (ref 1.5–2.5)

## 2013-06-28 LAB — TSH: TSH: 3.192 u[IU]/mL (ref 0.350–4.500)

## 2013-06-28 NOTE — Progress Notes (Signed)
Patient ID: [name], male   DOB: 02/20/1941, 73 y.o.   MRN: [id number]    This very nice 73 y.o. WWM presents for 3 month follow up with Hypertension, Hyperlipidemia, T2 NIDDM with Stage IV CKD  and Vitamin D Deficiency.    HTN predates since 2008. BP has been controlled at home. Today's BP: 112/66 mmHg . Patient denies any cardiac type chest pain, palpitations, dyspnea/orthopnea/PND, dizziness, claudication, or dependent edema.   Hyperlipidemia is controlled with diet & meds. Last Cholesterol was  , Triglycerides were    , HDL    and LDL    . Patient denies myalgias or other med SE's.    Also, the patient has history of T1 IDDM w/ CKD Stage IV (GFR 19)  since 1993  with  A1c of  6.9% today.  Patient adamantly refuses to consider Dialysis. Patient denies any symptoms of reactive hypoglycemia, diabetic polys, paresthesias or visual blurring.   Further, Patient has history of Vitamin D Deficiency of 43 in 2009 on supplements with last vitamin D of 85 in Oct 2014. Patient supplements vitamin D without any suspected side-effects.    Medication List     allopurinol 300 MG tablet  Commonly known as:  ZYLOPRIM  Take 1 tablet (300 mg total) by mouth daily.     aspirin 81 MG tablet  Take 81 mg by mouth daily.     carvedilol 12.5 MG tablet  Commonly known as:  COREG  Take 12.5 mg by mouth 2 (two) times daily with a meal.     cloNIDine 0.2 MG tablet  Commonly known as:  CATAPRES  Take 0.2 mg by mouth 2 (two) times daily.     Fish Oil 1000 MG Caps  Take by mouth.     FLAX SEED OIL PO  Take by mouth.     furosemide 40 MG tablet  Commonly known as:  LASIX  Take 40 mg by mouth.     insulin glargine 100 UNIT/ML injection  Commonly known as:  LANTUS  Inject 45 Units into the skin at bedtime.     isosorbide-hydrALAZINE 20-37.5 MG per tablet  Commonly known as:  BIDIL  Take by mouth 3 (three) times daily.     vitamin B-12 1000 MCG tablet  Commonly known as:  CYANOCOBALAMIN  Take  1,000 mcg by mouth daily.         Allergies  Allergen Reactions  . Ace Inhibitors   . Citalopram   . Penicillins     PMHx:   Past Medical History  Diagnosis Date  . Hypertension   . Hyperlipidemia   . Diabetes mellitus with renal complications   . CHF (congestive heart failure)   . DVT (deep venous thrombosis)     history  . Vitamin D deficiency   . Obesity   . Nephrolithiasis   . BPH (benign prostatic hyperplasia)   . Gout     FHx:    Reviewed / unchanged  SHx:    Reviewed / unchanged  Systems Review: Constitutional: Denies fever, chills, wt changes, headaches, insomnia, fatigue, night sweats, change in appetite. Eyes: Denies redness, blurred vision, diplopia, discharge, itchy, watery eyes.  ENT: Denies discharge, congestion, post nasal drip, epistaxis, sore throat, earache, hearing loss, dental pain, tinnitus, vertigo, sinus pain, snoring.  CV: Denies chest pain, palpitations, irregular heartbeat, syncope, dyspnea, diaphoresis, orthopnea, PND, claudication, edema. Respiratory: denies cough, dyspnea, DOE, pleurisy, hoarseness, laryngitis, wheezing.  Gastrointestinal: Denies dysphagia, odynophagia, heartburn, reflux, water brash,  abdominal pain or cramps, nausea, vomiting, bloating, diarrhea, constipation, hematemesis, melena, hematochezia,  or hemorrhoids. Genitourinary: Denies dysuria, frequency, urgency, nocturia, hesitancy, discharge, hematuria, flank pain. Musculoskeletal: Denies arthralgias, myalgias, stiffness, jt. swelling, pain, limp, strain/sprain.  Skin: Denies pruritus, rash, hives, warts, acne, eczema, change in skin lesion(s). Neuro: No weakness, tremor, incoordination, spasms, paresthesia, or pain. Psychiatric: Denies confusion, memory loss, or sensory loss. Endo: Denies change in weight, skin, hair change.  Heme/Lymph: No excessive bleeding, bruising, orenlarged lymph nodes.  BP: 112/66  Pulse: 76  Temp: 97.9 F (36.6 C)  Resp: 16    Estimated  body mass index is 33.04 kg/(m^2) as calculated from the following:   Height as of 03/31/13: 5\' 7"  (1.702 m).   Weight as of this encounter: 211 lb (95.709 kg).  On Exam: Appears well nourished - in no distress. Eyes: PERRLA, EOMs, conjunctiva no swelling or erythema. Sinuses: No frontal/maxillary tenderness ENT/Mouth: EAC's clear, TM's nl w/o erythema, bulging. Nares clear w/o erythema, swelling, exudates. Oropharynx clear without erythema or exudates. Oral hygiene is good. Tongue normal, non obstructing. Hearing intact.  Neck: Supple. Thyroid nl. Car 2+/2+ without bruits, nodes or JVD. Chest: Respirations nl with BS clear & equal w/o rales, rhonchi, wheezing or stridor.  Cor: Heart sounds normal w/ regular rate and rhythm without sig. murmurs, gallops, clicks, or rubs. Peripheral pulses normal and equal  without edema.  Abdomen: Soft & bowel sounds normal. Non-tender w/o guarding, rebound, hernias, masses, or organomegaly.  Lymphatics: Unremarkable.  Musculoskeletal: Full ROM all peripheral extremities, joint stability, 5/5 strength, and normal gait.  Skin: Warm, dry without exposed rashes, lesions, ecchymosis apparent.  Neuro: Cranial nerves intact, reflexes equal bilaterally. Sensory-motor testing grossly intact. Tendon reflexes grossly intact.  Pysch: Alert & oriented x 3. Insight and judgement nl & appropriate. No ideations.  Assessment and Plan:  1. Hypertension - Continue monitor blood pressure at home. Continue diet/meds same.  2. Hyperlipidemia - Continue diet/meds, exercise,& lifestyle modifications. Continue monitor periodic cholesterol/liver & renal functions   3. T1 IDDM w/ CKD (IV) - continue recommend prudent low glycemic diet, weight control, regular exercise, diabetic monitoring and periodic eye exams.  4. Vitamin D Deficiency - Continue supplementation.  5. CKD, Stage IV (GFR 19) (Note patient refuses Dialysis)  6. Obesity (BMI 33)  Recommended regular exercise, BP  monitoring, weight loss, and discussed med and SE's. Recommended labs to assess and monitor clinical status. Further disposition pending results of labs.

## 2013-06-28 NOTE — Patient Instructions (Signed)
Type 1 Diabetes Mellitus, Adult Type 1 diabetes mellitus, often simply referred to as diabetes, is a long-term (chronic) disease. It occurs when the islet cells in the pancreas that make insulin (a hormone) are destroyed and can no longer make insulin. Insulin is needed to move sugars from food into the tissue cells. The tissue cells use the sugars for energy. In people with type 1 diabetes, the sugars build up in the blood instead of going into the tissue cells. As a result, high blood sugar (hyperglycemia) develops. Without insulin, the body breaks down fat cells for the needed energy. This breakdown of fat cells produces acid chemicals (ketones), which increases the acid levels in the body. The effect of either high ketone or sugar (glucose) levels can be life-threatening.  Type 1 diabetes was also previously called juvenile diabetes. It most often occurs before the age of 30, but it can occur at any age. RISK FACTORS A person is predisposed to developing type 1 diabetes if someone in his or her family has the disease and is exposed to certain additional environmental triggers.  SYMPTOMS  Symptoms of type 1 diabetes may develop gradually over days to weeks or suddenly. The symptoms occur due to hyperglycemia. The symptoms can include:   Increased thirst (polydipsia).  Increased urination (polyuria).  Increased urination during the night (nocturia).  Weight loss. This weight loss may be rapid.  Frequent, recurring infections.  Tiredness (fatigue).  Weakness.  Vision changes, such as blurred vision.  Fruity smell to your breath.  Abdominal pain.  Nausea or vomiting. DIAGNOSIS  Type 1 diabetes is diagnosed when symptoms of diabetes are present and when blood glucose levels are increased. Your blood glucose level may be checked by one or more of the following blood tests:  A fasting blood glucose test. You will not be allowed to eat for at least 8 hours before a blood sample is  taken.  A random blood glucose test. Your blood glucose is checked at any time of the day regardless of when you ate.  A hemoglobin A1c blood glucose test. A hemoglobin A1c test provides information about blood glucose control over the previous 3 months. TREATMENT  Although type 1 diabetes cannot be prevented, it can be managed with insulin, diet, and exercise.  You will need to take insulin daily to keep blood glucose in the desired range.  You will need to match insulin dosing with exercise and healthy food choices. The treatment goal is to maintain the before-meal blood sugar (preprandial glucose) level at 70 130 mg/dL.  HOME CARE INSTRUCTIONS   Have your hemoglobin A1c level checked twice a year.  Perform daily blood glucose monitoring as directed by your caregiver.  Monitor urine ketones when you are ill and as directed by your caregiver.  Take your insulin as directed by your caregiver to maintain your blood glucose level in the desired range.  Never run out of insulin. It is needed every day.  Adjust insulin based on your intake of carbohydrates. Carbohydrates can raise blood glucose levels but need to be included in your diet. Carbohydrates provide vitamins, minerals, and fiber, which are an essential part of a healthy diet. Carbohydrates are found in fruits, vegetables, whole grains, dairy products, legumes, and foods containing added sugars.    Eat healthy foods. Alternate 3 meals with 3 snacks.  Maintain a healthy weight.  Carry a medical alert card or wear your medical alert jewelry.  Carry a 15 gram carbohydrate snack with you   at all times to treat low blood glucose (hypoglycemia). Some examples of 15 gram carbohydrate snacks include:  Glucose tablets, 3 or 4.   Glucose gel, 15 gram tube.  Raisins, 2 tablespoons (24 grams).  Jelly beans, 6.  Animal crackers, 8.  Fruit juice, regular soda, or low-fat milk, 4 ounces (120 mL).  Gummy treats,  9.    Recognize hypoglycemia. Hypoglycemia occurs with blood glucose levels of 70 mg/dL and below. The risk for hypoglycemia increases when fasting or skipping meals, during or after intense exercise, and during sleep. Hypoglycemia symptoms can include:  Tremors or shakes.  Decreased ability to concentrate.  Sweating.  Increased heart rate.  Headache.  Dry mouth.  Hunger.  Irritability.  Anxiety.  Restless sleep.  Altered speech or coordination.  Confusion.  Treat hypoglycemia promptly. If you are alert and able to safely swallow, follow the 15:15 rule:  Take 15 20 grams of rapid-acting glucose or carbohydrate. Rapid-acting options include glucose gel, glucose tablets, or 4 ounces (120 mL) of fruit juice, regular soda, or low-fat milk.  Check your blood glucose level 15 minutes after taking the glucose.   Take 15 20 grams more of glucose if the repeat blood glucose level is still 70 mg/dL or below.  Eat a meal or snack within 1 hour once blood glucose levels return to normal.  Be alert to polyuria and polydipsia, which are early signs of hyperglycemia. An early awareness of hyperglycemia allows for prompt treatment. Treat hyperglycemia as directed by your caregiver.  Engage in at least 150 minutes of moderate-intensity physical activity a week, spread over at least 3 days of the week or as directed by your caregiver.  Adjust your insulin dosing and food intake as needed if you start a new exercise or sport.  Follow your sick day plan at any time you are unable to eat or drink as usual.   Avoid tobacco use.  Limit alcohol intake to no more than 1 drink per day for nonpregnant women and 2 drinks per day for men. You should drink alcohol only when you are also eating food. Talk with your caregiver about whether alcohol is safe for you. Tell your caregiver if you drink alcohol several times a week.  Follow up with your caregiver regularly.  Schedule an eye exam  within 5 years of diagnosis and then annually.  Perform daily skin and foot care. Examine your skin and feet daily for cuts, bruises, redness, nail problems, bleeding, blisters, or sores. A foot exam by a caregiver should be done annually.  Brush your teeth and gums at least twice a day and floss at least once a day. Follow up with your dentist regularly.  Share your diabetes management plan with your workplace or school.  Stay up-to-date with immunizations.  Learn to manage stress.  Obtain ongoing diabetes education and support as needed.  Participate or seek rehabilitation as needed to maintain or improve independence and quality of life. Request a physical or occupational therapy referral if you are having foot or hand numbness or difficulties with grooming, dressing, eating, or physical activity. SEEK MEDICAL CARE IF:   You are unable to eat food or drink fluids for more than 6 hours.  You have nausea and vomiting for more than 6 hours.  Your blood glucose level is over 240 mg/dL.  There is a change in mental status.  You develop an additional serious illness.  You have diarrhea for more than 6 hours.  You have been   sick or have had a fever for a couple of days and are not getting better.  You have pain during any physical activity. SEEK IMMEDIATE MEDICAL CARE IF:  You have difficulty breathing.  You have moderate to large ketone levels. MAKE SURE YOU:  Understand these instructions.  Will watch your condition.  Will get help right away if you are not doing well or get worse. Document Released: 04/10/2000 Document Revised: 01/06/2012 Document Reviewed: 11/10/2011 [employer] Patient Information 2014 [employer].    Hypertension As your heart beats, it forces blood through your arteries. This force is your blood pressure. If the pressure is too high, it is called hypertension (HTN) or high blood pressure. HTN is dangerous because you may have it and not know it.  High blood pressure may mean that your heart has to work harder to pump blood. Your arteries may be narrow or stiff. The extra work puts you at risk for heart disease, stroke, and other problems.  Blood pressure consists of two numbers, a higher number over a lower, 110/72, for example. It is stated as "110 over 72." The ideal is below 120 for the top number (systolic) and under 80 for the bottom (diastolic). Write down your blood pressure today. You should pay close attention to your blood pressure if you have certain conditions such as:  Heart failure.  Prior heart attack.  Diabetes  Chronic kidney disease.  Prior stroke.  Multiple risk factors for heart disease. To see if you have HTN, your blood pressure should be measured while you are seated with your arm held at the level of the heart. It should be measured at least twice. A one-time elevated blood pressure reading (especially in the Emergency Department) does not mean that you need treatment. There may be conditions in which the blood pressure is different between your right and left arms. It is important to see your caregiver soon for a recheck. Most people have essential hypertension which means that there is not a specific cause. This type of high blood pressure may be lowered by changing lifestyle factors such as:  Stress.  Smoking.  Lack of exercise.  Excessive weight.  Drug/tobacco/alcohol use.  Eating less salt. Most people do not have symptoms from high blood pressure until it has caused damage to the body. Effective treatment can often prevent, delay or reduce that damage. TREATMENT  When a cause has been identified, treatment for high blood pressure is directed at the cause. There are a large number of medications to treat HTN. These fall into several categories, and your caregiver will help you select the medicines that are best for you. Medications may have side effects. You should review side effects with your  caregiver. If your blood pressure stays high after you have made lifestyle changes or started on medicines,   Your medication(s) may need to be changed.  Other problems may need to be addressed.  Be certain you understand your prescriptions, and know how and when to take your medicine.  Be sure to follow up with your caregiver within the time frame advised (usually within two weeks) to have your blood pressure rechecked and to review your medications.  If you are taking more than one medicine to lower your blood pressure, make sure you know how and at what times they should be taken. Taking two medicines at the same time can result in blood pressure that is too low. SEEK IMMEDIATE MEDICAL CARE IF:  You develop a severe headache,   blurred or changing vision, or confusion.  You have unusual weakness or numbness, or a faint feeling.  You have severe chest or abdominal pain, vomiting, or breathing problems. MAKE SURE YOU:   Understand these instructions.  Will watch your condition.  Will get help right away if you are not doing well or get worse.   Diabetes and Exercise Exercising regularly is important. It is not just about losing weight. It has many health benefits, such as:  Improving your overall fitness, flexibility, and endurance.  Increasing your bone density.  Helping with weight control.  Decreasing your body fat.  Increasing your muscle strength.  Reducing stress and tension.  Improving your overall health. People with diabetes who exercise gain additional benefits because exercise:  Reduces appetite.  Improves the body's use of blood sugar (glucose).  Helps lower or control blood glucose.  Decreases blood pressure.  Helps control blood lipids (such as cholesterol and triglycerides).  Improves the body's use of the hormone insulin by:  Increasing the body's insulin sensitivity.  Reducing the body's insulin needs.  Decreases the risk for heart disease  because exercising:  Lowers cholesterol and triglycerides levels.  Increases the levels of good cholesterol (such as high-density lipoproteins [HDL]) in the body.  Lowers blood glucose levels. YOUR ACTIVITY PLAN  Choose an activity that you enjoy and set realistic goals. Your health care provider or diabetes educator can help you make an activity plan that works for you. You can break activities into 2 or 3 sessions throughout the day. Doing so is as good as one long session. Exercise ideas include:  Taking the dog for a walk.  Taking the stairs instead of the elevator.  Dancing to your favorite song.  Doing your favorite exercise with a friend. RECOMMENDATIONS FOR EXERCISING WITH TYPE 1 OR TYPE 2 DIABETES   Check your blood glucose before exercising. If blood glucose levels are greater than 240 mg/dL, check for urine ketones. Do not exercise if ketones are present.  Avoid injecting insulin into areas of the body that are going to be exercised. For example, avoid injecting insulin into:  The arms when playing tennis.  The legs when jogging.  Keep a record of:  Food intake before and after you exercise.  Expected peak times of insulin action.  Blood glucose levels before and after you exercise.  The type and amount of exercise you have done.  Review your records with your health care provider. Your health care provider will help you to develop guidelines for adjusting food intake and insulin amounts before and after exercising.  If you take insulin or oral hypoglycemic agents, watch for signs and symptoms of hypoglycemia. They include:  Dizziness.  Shaking.  Sweating.  Chills.  Confusion.  Drink plenty of water while you exercise to prevent dehydration or heat stroke. Body water is lost during exercise and must be replaced.  Talk to your health care provider before starting an exercise program to make sure it is safe for you. Remember, almost any type of activity  is better than none.    Cholesterol Cholesterol is a white, waxy, fat-like protein needed by your body in small amounts. The liver makes all the cholesterol you need. It is carried from the liver by the blood through the blood vessels. Deposits (plaque) may build up on blood vessel walls. This makes the arteries narrower and stiffer. Plaque increases the risk for heart attack and stroke. You cannot feel your cholesterol level even if it is   very high. The only way to know is by a blood test to check your lipid (fats) levels. Once you know your cholesterol levels, you should keep a record of the test results. Work with your caregiver to to keep your levels in the desired range. WHAT THE RESULTS MEAN:  Total cholesterol is a rough measure of all the cholesterol in your blood.  LDL is the so-called bad cholesterol. This is the type that deposits cholesterol in the walls of the arteries. You want this level to be low.  HDL is the good cholesterol because it cleans the arteries and carries the LDL away. You want this level to be high.  Triglycerides are fat that the body can either burn for energy or store. High levels are closely linked to heart disease. DESIRED LEVELS:  Total cholesterol below 200.  LDL below 100 for people at risk, below 70 for very high risk.  HDL above 50 is good, above 60 is best.  Triglycerides below 150. HOW TO LOWER YOUR CHOLESTEROL:  Diet.  Choose fish or white meat chicken and turkey, roasted or baked. Limit fatty cuts of red meat, fried foods, and processed meats, such as sausage and lunch meat.  Eat lots of fresh fruits and vegetables. Choose whole grains, beans, pasta, potatoes and cereals.  Use only small amounts of olive, corn or canola oils. Avoid butter, mayonnaise, shortening or palm kernel oils. Avoid foods with trans-fats.  Use skim/nonfat milk and low-fat/nonfat yogurt and cheeses. Avoid whole milk, cream, ice cream, egg yolks and cheeses. Healthy  desserts include angel food cake, ginger snaps, animal crackers, hard candy, popsicles, and low-fat/nonfat frozen yogurt. Avoid pastries, cakes, pies and cookies.  Exercise.  A regular program helps decrease LDL and raises HDL.  Helps with weight control.  Do things that increase your activity level like gardening, walking, or taking the stairs.  Medication.  May be prescribed by your caregiver to help lowering cholesterol and the risk for heart disease.  You may need medicine even if your levels are normal if you have several risk factors. HOME CARE INSTRUCTIONS   Follow your diet and exercise programs as suggested by your caregiver.  Take medications as directed.  Have blood work done when your caregiver feels it is necessary. MAKE SURE YOU:   Understand these instructions.  Will watch your condition.  Will get help right away if you are not doing well or get worse.      Vitamin D Deficiency Vitamin D is an important vitamin that your body needs. Having too little of it in your body is called a deficiency. A very bad deficiency can make your bones soft and can cause a condition called rickets.  Vitamin D is important to your body for different reasons, such as:   It helps your body absorb 2 minerals called calcium and phosphorus.  It helps make your bones healthy.  It may prevent some diseases, such as diabetes and multiple sclerosis.  It helps your muscles and heart. You can get vitamin D in several ways. It is a natural part of some foods. The vitamin is also added to some dairy products and cereals. Some people take vitamin D supplements. Also, your body makes vitamin D when you are in the sun. It changes the sun's rays into a form of the vitamin that your body can use. CAUSES   Not eating enough foods that contain vitamin D.  Not getting enough sunlight.  Having certain digestive system diseases that   make it hard to absorb vitamin D. These diseases include  Crohn's disease, chronic pancreatitis, and cystic fibrosis.  Having a surgery in which part of the stomach or small intestine is removed.  Being obese. Fat cells pull vitamin D out of your blood. That means that obese people may not have enough vitamin D left in their blood and in other body tissues.  Having chronic kidney or liver disease. RISK FACTORS Risk factors are things that make you more likely to develop a vitamin D deficiency. They include:  Being older.  Not being able to get outside very much.  Living in a nursing home.  Having had broken bones.  Having weak or thin bones (osteoporosis).  Having a disease or condition that changes how your body absorbs vitamin D.  Having dark skin.  Some medicines such as seizure medicines or steroids.  Being overweight or obese. SYMPTOMS Mild cases of vitamin D deficiency may not have any symptoms. If you have a very bad case, symptoms may include:  Bone pain.  Muscle pain.  Falling often.  Broken bones caused by a minor injury, due to osteoporosis. DIAGNOSIS A blood test is the best way to tell if you have a vitamin D deficiency. TREATMENT Vitamin D deficiency can be treated in different ways. Treatment for vitamin D deficiency depends on what is causing it. Options include:  Taking vitamin D supplements.  Taking a calcium supplement. Your caregiver will suggest what dose is best for you. HOME CARE INSTRUCTIONS  Take any supplements that your caregiver prescribes. Follow the directions carefully. Take only the suggested amount.  Have your blood tested 2 months after you start taking supplements.  Eat foods that contain vitamin D. Healthy choices include:  Fortified dairy products, cereals, or juices. Fortified means vitamin D has been added to the food. Check the label on the package to be sure.  Fatty fish like salmon or trout.  Eggs.  Oysters.  Do not use a tanning bed.  Keep your weight at a healthy  level. Lose weight if you need to.  Keep all follow-up appointments. Your caregiver will need to perform blood tests to make sure your vitamin D deficiency is going away. SEEK MEDICAL CARE IF:  You have any questions about your treatment.  You continue to have symptoms of vitamin D deficiency.  You have nausea or vomiting.  You are constipated.  You feel confused.  You have severe abdominal or back pain. MAKE SURE YOU:  Understand these instructions.  Will watch your condition.  Will get help right away if you are not doing well or get worse.   

## 2013-06-29 LAB — VITAMIN D 25 HYDROXY (VIT D DEFICIENCY, FRACTURES): Vit D, 25-Hydroxy: 78 ng/mL (ref 30–89)

## 2013-07-06 ENCOUNTER — Other Ambulatory Visit: Payer: Self-pay | Admitting: Internal Medicine

## 2013-09-29 ENCOUNTER — Ambulatory Visit (INDEPENDENT_AMBULATORY_CARE_PROVIDER_SITE_OTHER): Payer: Commercial Managed Care - HMO | Admitting: Emergency Medicine

## 2013-09-29 VITALS — BP 116/64 | HR 74 | Temp 98.4°F | Resp 16 | Ht 67.0 in | Wt 213.0 lb

## 2013-09-29 DIAGNOSIS — Z789 Other specified health status: Secondary | ICD-10-CM

## 2013-09-29 DIAGNOSIS — I10 Essential (primary) hypertension: Secondary | ICD-10-CM

## 2013-09-29 DIAGNOSIS — Z1331 Encounter for screening for depression: Secondary | ICD-10-CM

## 2013-09-29 DIAGNOSIS — E782 Mixed hyperlipidemia: Secondary | ICD-10-CM

## 2013-09-29 DIAGNOSIS — E1129 Type 2 diabetes mellitus with other diabetic kidney complication: Secondary | ICD-10-CM

## 2013-09-29 DIAGNOSIS — Z0000 Encounter for general adult medical examination without abnormal findings: Secondary | ICD-10-CM

## 2013-09-29 DIAGNOSIS — E1159 Type 2 diabetes mellitus with other circulatory complications: Secondary | ICD-10-CM

## 2013-09-29 DIAGNOSIS — M109 Gout, unspecified: Secondary | ICD-10-CM

## 2013-09-29 NOTE — Patient Instructions (Signed)
Allergic Rhinitis Allergic rhinitis is when the mucous membranes in the nose respond to allergens. Allergens are particles in the air that cause your body to have an allergic reaction. This causes you to release allergic antibodies. Through a chain of events, these eventually cause you to release histamine into the blood stream. Although meant to protect the body, it is this release of histamine that causes your discomfort, such as frequent sneezing, congestion, and an itchy, runny nose.  CAUSES  Seasonal allergic rhinitis (hay fever) is caused by pollen allergens that may come from grasses, trees, and weeds. Year-round allergic rhinitis (perennial allergic rhinitis) is caused by allergens such as house dust mites, pet dander, and mold spores.  SYMPTOMS   Nasal stuffiness (congestion).  Itchy, runny nose with sneezing and tearing of the eyes. DIAGNOSIS  Your health care provider can help you determine the allergen or allergens that trigger your symptoms. If you and your health care provider are unable to determine the allergen, skin or blood testing may be used. TREATMENT  Allergic Rhinitis does not have a cure, but it can be controlled by:  Medicines and allergy shots (immunotherapy).  Avoiding the allergen. Hay fever may often be treated with antihistamines in pill or nasal spray forms. Antihistamines block the effects of histamine. There are over-the-counter medicines that may help with nasal congestion and swelling around the eyes. Check with your health care provider before taking or giving this medicine.  If avoiding the allergen or the medicine prescribed do not work, there are many new medicines your health care provider can prescribe. Stronger medicine may be used if initial measures are ineffective. Desensitizing injections can be used if medicine and avoidance does not work. Desensitization is when a patient is given ongoing shots until the body becomes less sensitive to the allergen.  Make sure you follow up with your health care provider if problems continue. HOME CARE INSTRUCTIONS It is not possible to completely avoid allergens, but you can reduce your symptoms by taking steps to limit your exposure to them. It helps to know exactly what you are allergic to so that you can avoid your specific triggers. SEEK MEDICAL CARE IF:   You have a fever.  You develop a cough that does not stop easily (persistent).  You have shortness of breath.  You start wheezing.  Symptoms interfere with normal daily activities. Document Released: 01/06/2001 Document Revised: 02/01/2013 Document Reviewed: 12/19/2012 [employer] Patient Information 2014 [employer]. Diabetes and Foot Care Diabetes may cause you to have problems because of poor blood supply (circulation) to your feet and legs. This may cause the skin on your feet to become thinner, break easier, and heal more slowly. Your skin may become dry, and the skin may peel and crack. You may also have nerve damage in your legs and feet causing decreased feeling in them. You may not notice minor injuries to your feet that could lead to infections or more serious problems. Taking care of your feet is one of the most important things you can do for yourself.  HOME CARE INSTRUCTIONS  Wear shoes at all times, even in the house. Do not go barefoot. Bare feet are easily injured.  Check your feet daily for blisters, cuts, and redness. If you cannot see the bottom of your feet, use a mirror or ask someone for help.  Wash your feet with warm water (do not use hot water) and mild soap. Then pat your feet and the areas between your toes until   they are completely dry. Do not soak your feet as this can dry your skin.  Apply a moisturizing lotion or petroleum jelly (that does not contain alcohol and is unscented) to the skin on your feet and to dry, brittle toenails. Do not apply lotion between your toes.  Trim your toenails straight across. Do not  dig under them or around the cuticle. File the edges of your nails with an emery board or nail file.  Do not cut corns or calluses or try to remove them with medicine.  Wear clean socks or stockings every day. Make sure they are not too tight. Do not wear knee-high stockings since they may decrease blood flow to your legs.  Wear shoes that fit properly and have enough cushioning. To break in new shoes, wear them for just a few hours a day. This prevents you from injuring your feet. Always look in your shoes before you put them on to be sure there are no objects inside.  Do not cross your legs. This may decrease the blood flow to your feet.  If you find a minor scrape, cut, or break in the skin on your feet, keep it and the skin around it clean and dry. These areas may be cleansed with mild soap and water. Do not cleanse the area with peroxide, alcohol, or iodine.  When you remove an adhesive bandage, be sure not to damage the skin around it.  If you have a wound, look at it several times a day to make sure it is healing.  Do not use heating pads or hot water bottles. They may burn your skin. If you have lost feeling in your feet or legs, you may not know it is happening until it is too late.  Make sure your health care provider performs a complete foot exam at least annually or more often if you have foot problems. Report any cuts, sores, or bruises to your health care provider immediately. SEEK MEDICAL CARE IF:   You have an injury that is not healing.  You have cuts or breaks in the skin.  You have an ingrown nail.  You notice redness on your legs or feet.  You feel burning or tingling in your legs or feet.  You have pain or cramps in your legs and feet.  Your legs or feet are numb.  Your feet always feel cold. SEEK IMMEDIATE MEDICAL CARE IF:   There is increasing redness, swelling, or pain in or around a wound.  There is a red line that goes up your leg.  Pus is coming  from a wound.  You develop a fever or as directed by your health care provider.  You notice a bad smell coming from an ulcer or wound. Document Released: 04/10/2000 Document Revised: 12/14/2012 Document Reviewed: 09/20/2012 [employer] Patient Information 2014 [employer].  

## 2013-09-29 NOTE — Progress Notes (Signed)
Patient ID: [name], male   DOB: 1940/06/20, 73 y.o.   MRN: [id number] Subjective:  [name] is a 73 y.o. male who presents for Medicare Annual Wellness Visit and 3 month follow up for HTN, hyperlipidemia, diabetes, and vitamin D Def.  Date of last medicare wellness visit was is unknown.  His blood pressure has been controlled at home, today their BP is BP: 116/64 mmHg He does workout. He denies chest pain, shortness of breath, dizziness.  He is not on cholesterol medication and denies myalgias. His cholesterol is not at goal. The cholesterol last visit was:   Lab Results  Component Value Date   CHOL 182 06/28/2013   HDL 28* 06/28/2013   LDLCALC 80 06/28/2013   TRIG 371* 06/28/2013   CHOLHDL 6.5 06/28/2013   He has not been working on diet and exercise for diabetes, and denies foot ulcerations and visual disturbances. Last A1C in the office was:  Lab Results  Component Value Date   HGBA1C 6.9* 06/28/2013   Patient is on Vitamin D supplement.   No components found with this basename: VITD25OH     Names of Other Physician/Practitioners you currently use: Patient Care Team: Lucky Cowboy, MD as PCP - General (Internal Medicine) Lesleigh Noe, MD as Consulting Physician (Cardiology) NO Dentist with Full Dentures NO eye doctor  Medication Review: Current Outpatient Prescriptions on File Prior to Visit  Medication Sig Dispense Refill  . allopurinol (ZYLOPRIM) 300 MG tablet Take 1 tablet (300 mg total) by mouth daily.  90 tablet  2  . aspirin 81 MG tablet Take 81 mg by mouth daily.      Marland Kitchen BIDIL 20-37.5 MG per tablet TAKE 1 TABLET THREE TIMES DAILY FOR BLOOD PRESSURE  AND  HEART  270 tablet  1  . carvedilol (COREG) 12.5 MG tablet TAKE 1 TABLET TWICE DAILY FOR BLOOD PRESSURE/HEART  180 tablet  1  . cloNIDine (CATAPRES) 0.2 MG tablet Take 0.2 mg by mouth 2 (two) times daily.      . Flaxseed, Linseed, (FLAX SEED OIL PO) Take by mouth.      . furosemide (LASIX) 40 MG tablet TAKE 1  TABLET EVERY DAY FOR BLOOD PRESSURE  AND  FLUID  90 tablet  3  . insulin glargine (LANTUS) 100 UNIT/ML injection Inject 48 Units into the skin daily.       Marland Kitchen losartan (COZAAR) 100 MG tablet TAKE 1 TABLET EVERY DAY FOR BLOOD PRESSURE  90 tablet  3  . Omega-3 Fatty Acids (FISH OIL) 1000 MG CAPS Take by mouth.      . vitamin B-12 (CYANOCOBALAMIN) 1000 MCG tablet Take 1,000 mcg by mouth daily.       No current facility-administered medications on file prior to visit.   Allergies  Allergen Reactions  . Ace Inhibitors   . Citalopram   . Penicillins      Current Problems (verified) Patient Active Problem List   Diagnosis Date Noted  . Encounter for long-term (current) use of other medications 06/28/2013  . Hypertension   . Hyperlipidemia   . Diabetes mellitus with renal complications   . CHF (congestive heart failure)   . Vitamin D deficiency   . Obesity     Screening Tests Health Maintenance  Topic Date Due  . Ophthalmology Exam  04/07/1949  . Urine Microalbumin  04/07/1949  . Colonoscopy  04/07/1989  . Pneumococcal Polysaccharide Vaccine Age 19 And Over  04/07/2004  . Influenza Vaccine  11/25/2013  .  Hemoglobin A1c  12/29/2013  . Foot Exam  09/30/2014  . Tetanus/tdap  03/30/2019  . Zostavax  Completed    Immunization History  Administered Date(s) Administered  . Td 03/29/2009  . Zoster 12/29/2012    Preventative care: Last colonoscopy: REFUSES  Prior vaccinations: TD: 2010  Influenza:refuses Pneumococcal:REFUSES Shingles/Zostavax: 2014  History reviewed:  Past Medical History  Diagnosis Date  . Hypertension   . Hyperlipidemia   . Diabetes mellitus with renal complications   . CHF (congestive heart failure)   . DVT (deep venous thrombosis)     history  . Vitamin D deficiency   . Obesity   . Nephrolithiasis   . BPH (benign prostatic hyperplasia)   . Gout    Past Surgical History  Procedure Laterality Date  . Eye surgery Bilateral     cataracts  .  Ureteral stent placement Left 2008  . Cardiac catheterization  1999  . Thumb surgery  2007   History  Substance Use Topics  . Smoking status: Former Smoker -- 2.00 packs/day for 56 years    Quit date: 03/29/2010  . Smokeless tobacco: Never Used  . Alcohol Use: Yes   Family History  Problem Relation Age of Onset  . Cancer Mother     breast  . Heart disease Mother   . Heart disease Father   . Diabetes Father   . Heart disease Sister   . Hypertension Brother       Risk Factors: Tobacco History  Substance Use Topics  . Smoking status: Former Smoker -- 2.00 packs/day for 56 years    Quit date: 03/29/2010  . Smokeless tobacco: Never Used  . Alcohol Use: Yes   He does not smoke.  Patient is a former smoker. Are there smokers in your home (other than you)?  No  Alcohol Current alcohol use: social drinker  Caffeine Current caffeine use: coffee 20 oz /day  Exercise Current exercise habits: Home exercise routine includes walking 2 hrs per day.  Current exercise: Yard work/ walking  Nutrition/Diet Current diet: in general, a "healthy" diet    Cardiac risk factors: advanced age (older than 31 for men, 16 for women), diabetes mellitus, dyslipidemia, hypertension, male gender and smoking/ tobacco exposure.  Depression Screen Nurse depression screen reviewed.  (Note: if answer to either of the following is "Yes", a more complete depression screening is indicated)   Q1: Over the past two weeks, have you felt down, depressed or hopeless? No  Q2: Over the past two weeks, have you felt little interest or pleasure in doing things? No  Have you lost interest or pleasure in daily life? No  Do you often feel hopeless? No  Do you cry easily over simple problems? No  Activities of Daily Living Nurse ADLs screen reviewed.  In your present state of health, do you have any difficulty performing the following activities?:  Driving? No Managing money?  No Feeding yourself?  No Getting from bed to chair? No Climbing a flight of stairs? No Preparing food and eating?: No Bathing or showering? No Getting dressed: No Getting to the toilet? No Using the toilet:No Moving around from place to place: No In the past year have you fallen or had a near fall?:No   Are you sexually active?  No  Do you have more than one partner?  No  Vision Difficulties: No  Hearing Difficulties: No Do you often ask people to speak up or repeat themselves? No Do you experience ringing or noises in  your ears? No Do you have difficulty understanding soft or whispered voices? No  Cognition  Do you feel that you have a problem with memory?No  Do you often misplace items? No  Do you feel safe at home?  Yes  Advanced directives Does patient have a Health Care Power of Attorney? Yes Does patient have a Living Will? Yes   Objective:     Vision and hearing screens reviewed.   Blood pressure 116/64, pulse 74, temperature 98.4 F (36.9 C), temperature source Temporal, resp. rate 16, height 5\' 7"  (1.702 m), weight 213 lb (96.616 kg). Body mass index is 33.35 kg/(m^2).  General appearance: alert, no distress, WD/WN,Overweight male Cognitive Testing  Alert? Yes  Normal Appearance?Yes  Oriented to person? Yes  Place? Yes   Time? Yes  Recall of three objects?  Yes  Can perform simple calculations? Yes  Displays appropriate judgment?Yes  Can read the correct time from a watch face?Yes  HEENT: normocephalic, sclerae anicteric, TMs pearly, nares patent, no discharge or erythema, pharynx normal Oral cavity: MMM, no lesions Neck: supple, no lymphadenopathy, no thyromegaly, no masses Heart: RRR, normal S1, S2, no murmurs Lungs: CTA bilaterally, no wheezes, rhonchi, or rales Abdomen: +bs, soft, non tender, non distended, no masses, no hepatomegaly, no splenomegaly Musculoskeletal: nontender, no swelling, no obvious deformity Extremities: no edema, no cyanosis, no clubbing Pulses:  2+ symmetric, upper and lower extremities, normal cap refill SKIN:Scaling bottoms, thick nails with discoloration, 2nd toe right with blood, Venous insufficiency discoloration bilateral mid calf to ankles Neurological: alert, oriented x 3, CN2-12 intact, strength normal upper extremities and lower extremities,Decreased sensation bilateral toes/ outer edges bilateral feet, DTRs 2+ throughout, no cerebellar signs, gait normal Psychiatric: normal affect, behavior normal, pleasant   Assessment:  1. Medicare wellness update- Update screening labs/ History/ Immunizations/ Testing as needed. Advised healthy diet, QD exercise, increase H20 and continue RX/ Vitamins AD.  2. 3 month F/U for HTN, Cholesterol,DM, D. Deficient. Needs healthy diet, cardio QD and obtain healthy weight. Check Labs, Check BP if >130/80 call office, Check BS if >200 call office   3. Callus/ Bunion/ dry skin DM- Advised needs podiatry evaluation with  DM Hx, epsom salt soaks, vaseline treatment QD, monitor QD and call with any concerns/ adverse changes  Plan:   During the course of the visit the patient was educated and counseled about appropriate screening and preventive services including:    Diabetes screening  Glaucoma screening  Nutrition counseling   Screening recommendations, referrals: Vaccinations:ALL FOLLOWING UP TO DATE OR DECLINES  Tdap vaccine no  Influenza vaccine no Pneumococcal vaccine no Shingles vaccine no Hep B vaccine no  Nutrition assessed and recommended  Colonoscopy no, PT REFUSES  Recommended yearly ophthalmology/optometry visit for glaucoma screening and checkup- Advised OVERDUE NEEDS TO ESTABLISH Recommended yearly dental visit for hygiene and checkup Advanced directives - no  Conditions/risks identified: BMI: Discussed weight loss, diet, and increase physical activity.  Increase physical activity: AHA recommends 150 minutes of physical activity a week.  Medications reviewed Diabetes  is not at goal, ACE/ARB therapy: Yes. Urinary Incontinence is not an issue: discussed non pharmacology and pharmacology options.  Fall risk: low- discussed PT, home fall assessment, medications.    Medicare Attestation I have personally reviewed: The patient's medical and social history Their use of alcohol, tobacco or illicit drugs Their current medications and supplements The patient's functional ability including ADLs,fall risks, home safety risks, cognitive, and hearing and visual impairment Diet and physical activities Evidence for  depression or mood disorders  The patient's weight, height, BMI, and visual acuity have been recorded in the chart.  I have made referrals, counseling, and provided education to the patient based on review of the above and I have provided the patient with a written personalized care plan for preventive services.     Berenice Primas, PA-C   10/02/2013   CPT W0981 first AWV  CPT [phone] subsequent AWV

## 2013-10-02 ENCOUNTER — Encounter: Payer: Self-pay | Admitting: Emergency Medicine

## 2013-10-24 ENCOUNTER — Other Ambulatory Visit: Payer: Commercial Managed Care - HMO

## 2013-10-24 LAB — CBC WITH DIFFERENTIAL/PLATELET
BASOS ABS: 0 10*3/uL (ref 0.0–0.1)
Basophils Relative: 0 % (ref 0–1)
Eosinophils Absolute: 0.2 10*3/uL (ref 0.0–0.7)
Eosinophils Relative: 2 % (ref 0–5)
HEMATOCRIT: 43.1 % (ref 39.0–52.0)
Hemoglobin: 14.8 g/dL (ref 13.0–17.0)
LYMPHS PCT: 18 % (ref 12–46)
Lymphs Abs: 1.8 10*3/uL (ref 0.7–4.0)
MCH: 31.4 pg (ref 26.0–34.0)
MCHC: 34.3 g/dL (ref 30.0–36.0)
MCV: 91.5 fL (ref 78.0–100.0)
Monocytes Absolute: 0.6 10*3/uL (ref 0.1–1.0)
Monocytes Relative: 6 % (ref 3–12)
NEUTROS ABS: 7.5 10*3/uL (ref 1.7–7.7)
Neutrophils Relative %: 74 % (ref 43–77)
Platelets: 163 10*3/uL (ref 150–400)
RBC: 4.71 MIL/uL (ref 4.22–5.81)
RDW: 15.2 % (ref 11.5–15.5)
WBC: 10.1 10*3/uL (ref 4.0–10.5)

## 2013-11-20 ENCOUNTER — Other Ambulatory Visit: Payer: Self-pay | Admitting: *Deleted

## 2013-11-20 ENCOUNTER — Other Ambulatory Visit: Payer: Self-pay | Admitting: Internal Medicine

## 2013-11-20 MED ORDER — ISOSORB DINITRATE-HYDRALAZINE 20-37.5 MG PO TABS: ORAL_TABLET | ORAL | Status: DC

## 2013-12-23 ENCOUNTER — Encounter: Payer: Self-pay | Admitting: *Deleted

## 2013-12-29 ENCOUNTER — Other Ambulatory Visit: Payer: Self-pay | Admitting: Physician Assistant

## 2014-01-03 NOTE — Patient Instructions (Signed)
Recommend the book "The END of DIETING" by Dr Baker Janus   and the book "The END of DIABETES " by Dr Excell Seltzer  At [email or web address] - get book & Audio CD's      Being diabetic has a  300% increased risk for heart attack, stroke, cancer, and alzheimer- type vascular dementia. It is very important that you work harder with diet by avoiding all foods that are white except chicken & fish. Avoid white rice (brown & wild rice is OK), white potatoes (sweetpotatoes in moderation is OK), White bread or wheat bread or anything made out of white flour like bagels, donuts, rolls, buns, biscuits, cakes, pastries, cookies, pizza crust, and pasta (made from white flour & egg whites) - vegetarian pasta or spinach or wheat pasta is OK. Multigrain breads like Arnold's or [employer], or multigrain sandwich thins or flatbreads.  Diet, exercise and weight loss can reverse and cure diabetes in the early stages.  Diet, exercise and weight loss is very important in the control and prevention of complications of diabetes which affects every system in your body, ie. Brain - dementia/stroke, eyes - glaucoma/blindness, heart - heart attack/heart failure, kidneys - dialysis, stomach - gastric paralysis, intestines - malabsorption, nerves - severe painful neuritis, circulation - gangrene & loss of a leg(s), and finally cancer and Alzheimers.    I recommend avoid fried & greasy foods,  sweets/candy, white rice (brown or wild rice or Quinoa is OK), white potatoes (sweet potatoes are OK) - anything made from white flour - bagels, doughnuts, rolls, buns, biscuits,white and wheat breads, pizza crust and traditional pasta made of white flour & egg white(vegetarian pasta or spinach or wheat pasta is OK).  Multi-grain bread is OK - like multi-grain flat bread or sandwich thins. Avoid alcohol in excess. Exercise is also important.    Eat all the vegetables you want - avoid meat, especially red meat and dairy - especially cheese.  Cheese  is the most concentrated form of trans-fats which is the worst thing to clog up our arteries. Veggie cheese is OK which can be found in the fresh produce section at [employer] or [employer] or [employer]  Preventive Care for Adults A healthy lifestyle and preventive care can promote health and wellness. Preventive health guidelines for men include the following key practices:  A routine yearly physical is a good way to check with your health care provider about your health and preventative screening. It is a chance to share any concerns and updates on your health and to receive a thorough exam.  Visit your dentist for a routine exam and preventative care every 6 months. Brush your teeth twice a day and floss once a day. Good oral hygiene prevents tooth decay and gum disease.  The frequency of eye exams is based on your age, health, family medical history, use of contact lenses, and other factors. Follow your health care provider's recommendations for frequency of eye exams.  Eat a healthy diet. Foods such as vegetables, fruits, whole grains, low-fat dairy products, and lean protein foods contain the nutrients you need without too many calories. Decrease your intake of foods high in solid fats, added sugars, and salt. Eat the right amount of calories for you.Get information about a proper diet from your health care provider, if necessary.  Regular physical exercise is one of the most important things you can do for your health. Most adults should get at least 150 minutes of moderate-intensity exercise (any activity that  increases your heart rate and causes you to sweat) each week. In addition, most adults need muscle-strengthening exercises on 2 or more days a week.  Maintain a healthy weight. The body mass index (BMI) is a screening tool to identify possible weight problems. It provides an estimate of body fat based on height and weight. Your health care provider can find your BMI and can help you  achieve or maintain a healthy weight.For adults 20 years and older:  A BMI below 18.5 is considered underweight.  A BMI of 18.5 to 24.9 is normal.  A BMI of 25 to 29.9 is considered overweight.  A BMI of 30 and above is considered obese.  Maintain normal blood lipids and cholesterol levels by exercising and minimizing your intake of saturated fat. Eat a balanced diet with plenty of fruit and vegetables. Blood tests for lipids and cholesterol should begin at age 20 and be repeated every 5 years. If your lipid or cholesterol levels are high, you are over 50, or you are at high risk for heart disease, you may need your cholesterol levels checked more frequently.Ongoing high lipid and cholesterol levels should be treated with medicines if diet and exercise are not working.  If you smoke, find out from your health care provider how to quit. If you do not use tobacco, do not start.  Lung cancer screening is recommended for adults aged 72-80 years who are at high risk for developing lung cancer because of a history of smoking. A yearly low-dose CT scan of the lungs is recommended for people who have at least a 30-pack-year history of smoking and are a current smoker or have quit within the past 15 years. A pack year of smoking is smoking an average of 1 pack of cigarettes a day for 1 year (for example: 1 pack a day for 30 years or 2 packs a day for 15 years). Yearly screening should continue until the smoker has stopped smoking for at least 15 years. Yearly screening should be stopped for people who develop a health problem that would prevent them from having lung cancer treatment.  If you choose to drink alcohol, do not have more than 2 drinks per day. One drink is considered to be 12 ounces (355 mL) of beer, 5 ounces (148 mL) of wine, or 1.5 ounces (44 mL) of liquor.  Avoid use of street drugs. Do not share needles with anyone. Ask for help if you need support or instructions about stopping the use of  drugs.  High blood pressure causes heart disease and increases the risk of stroke. Your blood pressure should be checked at least every 1-2 years. Ongoing high blood pressure should be treated with medicines, if weight loss and exercise are not effective.  If you are 28-64 years old, ask your health care provider if you should take aspirin to prevent heart disease.  Diabetes screening involves taking a blood sample to check your fasting blood sugar level. This should be done once every 3 years, after age 13, if you are within normal weight and without risk factors for diabetes. Testing should be considered at a younger age or be carried out more frequently if you are overweight and have at least 1 risk factor for diabetes.  Colorectal cancer can be detected and often prevented. Most routine colorectal cancer screening begins at the age of 78 and continues through age 56. However, your health care provider may recommend screening at an earlier age if you have risk  factors for colon cancer. On a yearly basis, your health care provider may provide home test kits to check for hidden blood in the stool. Use of a small camera at the end of a tube to directly examine the colon (sigmoidoscopy or colonoscopy) can detect the earliest forms of colorectal cancer. Talk to your health care provider about this at age 48, when routine screening begins. Direct exam of the colon should be repeated every 5-10 years through age 60, unless early forms of precancerous polyps or small growths are found.  People who are at an increased risk for hepatitis B should be screened for this virus. You are considered at high risk for hepatitis B if:  You were born in a country where hepatitis B occurs often. Talk with your health care provider about which countries are considered high risk.  Your parents were born in a high-risk country and you have not received a shot to protect against hepatitis B (hepatitis B vaccine).  You have  HIV or AIDS.  You use needles to inject street drugs.  You live with, or have sex with, someone who has hepatitis B.  You are a man who has sex with other men (MSM).  You get hemodialysis treatment.  You take certain medicines for conditions such as cancer, organ transplantation, and autoimmune conditions.  Hepatitis C blood testing is recommended for all people born from [birth year] through 1965 and any individual with known risks for hepatitis C.  Practice safe sex. Use condoms and avoid high-risk sexual practices to reduce the spread of sexually transmitted infections (STIs). STIs include gonorrhea, chlamydia, syphilis, trichomonas, herpes, HPV, and human immunodeficiency virus (HIV). Herpes, HIV, and HPV are viral illnesses that have no cure. They can result in disability, cancer, and death.  If you are at risk of being infected with HIV, it is recommended that you take a prescription medicine daily to prevent HIV infection. This is called preexposure prophylaxis (PrEP). You are considered at risk if:  You are a man who has sex with other men (MSM) and have other risk factors.  You are a heterosexual man, are sexually active, and are at increased risk for HIV infection.  You take drugs by injection.  You are sexually active with a partner who has HIV.  Talk with your health care provider about whether you are at high risk of being infected with HIV. If you choose to begin PrEP, you should first be tested for HIV. You should then be tested every 3 months for as long as you are taking PrEP.  A one-time screening for abdominal aortic aneurysm (AAA) and surgical repair of large AAAs by ultrasound are recommended for men ages 51 to 11 years who are current or former smokers.  Healthy men should no longer receive prostate-specific antigen (PSA) blood tests as part of routine cancer screening. Talk with your health care provider about prostate cancer screening.  Testicular cancer screening is  not recommended for adult males who have no symptoms. Screening includes self-exam, a health care provider exam, and other screening tests. Consult with your health care provider about any symptoms you have or any concerns you have about testicular cancer.  Use sunscreen. Apply sunscreen liberally and repeatedly throughout the day. You should seek shade when your shadow is shorter than you. Protect yourself by wearing long sleeves, pants, a wide-brimmed hat, and sunglasses year round, whenever you are outdoors.  Once a month, do a whole-body skin exam, using a mirror to look  at the skin on your back. Tell your health care provider about new moles, moles that have irregular borders, moles that are larger than a pencil eraser, or moles that have changed in shape or color.  Stay current with required vaccines (immunizations).  Influenza vaccine. All adults should be immunized every year.  Tetanus, diphtheria, and acellular pertussis (Td, Tdap) vaccine. An adult who has not previously received Tdap or who does not know his vaccine status should receive 1 dose of Tdap. This initial dose should be followed by tetanus and diphtheria toxoids (Td) booster doses every 10 years. Adults with an unknown or incomplete history of completing a 3-dose immunization series with Td-containing vaccines should begin or complete a primary immunization series including a Tdap dose. Adults should receive a Td booster every 10 years.  Varicella vaccine. An adult without evidence of immunity to varicella should receive 2 doses or a second dose if he has previously received 1 dose.  Human papillomavirus (HPV) vaccine. Males aged 29-21 years who have not received the vaccine previously should receive the 3-dose series. Males aged 22-26 years may be immunized. Immunization is recommended through the age of 26 years for any male who has sex with males and did not get any or all doses earlier. Immunization is recommended for any  person with an immunocompromised condition through the age of 29 years if he did not get any or all doses earlier. During the 3-dose series, the second dose should be obtained 4-8 weeks after the first dose. The third dose should be obtained 24 weeks after the first dose and 16 weeks after the second dose.  Zoster vaccine. One dose is recommended for adults aged 38 years or older unless certain conditions are present.  Measles, mumps, and rubella (MMR) vaccine. Adults born before [birth year] generally are considered immune to measles and mumps. Adults born in [birth year] or later should have 1 or more doses of MMR vaccine unless there is a contraindication to the vaccine or there is laboratory evidence of immunity to each of the three diseases. A routine second dose of MMR vaccine should be obtained at least 28 days after the first dose for students attending postsecondary schools, health care workers, or international travelers. People who received inactivated measles vaccine or an unknown type of measles vaccine during 1963-1967 should receive 2 doses of MMR vaccine. People who received inactivated mumps vaccine or an unknown type of mumps vaccine before 1979 and are at high risk for mumps infection should consider immunization with 2 doses of MMR vaccine. Unvaccinated health care workers born before [birth year] who lack laboratory evidence of measles, mumps, or rubella immunity or laboratory confirmation of disease should consider measles and mumps immunization with 2 doses of MMR vaccine or rubella immunization with 1 dose of MMR vaccine.  Pneumococcal 13-valent conjugate (PCV13) vaccine. When indicated, a person who is uncertain of his immunization history and has no record of immunization should receive the PCV13 vaccine. An adult aged 68 years or older who has certain medical conditions and has not been previously immunized should receive 1 dose of PCV13 vaccine. This PCV13 should be followed with a dose of pneumococcal  polysaccharide (PPSV23) vaccine. The PPSV23 vaccine dose should be obtained at least 8 weeks after the dose of PCV13 vaccine. An adult aged 95 years or older who has certain medical conditions and previously received 1 or more doses of PPSV23 vaccine should receive 1 dose of PCV13. The PCV13 vaccine dose should be obtained 1  or more years after the last PPSV23 vaccine dose.  Pneumococcal polysaccharide (PPSV23) vaccine. When PCV13 is also indicated, PCV13 should be obtained first. All adults aged 59 years and older should be immunized. An adult younger than age 29 years who has certain medical conditions should be immunized. Any person who resides in a nursing home or long-term care facility should be immunized. An adult smoker should be immunized. People with an immunocompromised condition and certain other conditions should receive both PCV13 and PPSV23 vaccines. People with human immunodeficiency virus (HIV) infection should be immunized as soon as possible after diagnosis. Immunization during chemotherapy or radiation therapy should be avoided. Routine use of PPSV23 vaccine is not recommended for American Indians, [place] Natives, or people younger than 65 years unless there are medical conditions that require PPSV23 vaccine. When indicated, people who have unknown immunization and have no record of immunization should receive PPSV23 vaccine. One-time revaccination 5 years after the first dose of PPSV23 is recommended for people aged 19-64 years who have chronic kidney failure, nephrotic syndrome, asplenia, or immunocompromised conditions. People who received 1-2 doses of PPSV23 before age 20 years should receive another dose of PPSV23 vaccine at age 87 years or later if at least 5 years have passed since the previous dose. Doses of PPSV23 are not needed for people immunized with PPSV23 at or after age 23 years.  Meningococcal vaccine. Adults with asplenia or persistent complement component deficiencies  should receive 2 doses of quadrivalent meningococcal conjugate (MenACWY-D) vaccine. The doses should be obtained at least 2 months apart. Microbiologists working with certain meningococcal bacteria, [place] recruits, people at risk during an outbreak, and people who travel to or live in countries with a high rate of meningitis should be immunized. A first-year college student up through age 30 years who is living in a residence hall should receive a dose if he did not receive a dose on or after his 16th birthday. Adults who have certain high-risk conditions should receive one or more doses of vaccine.  Hepatitis A vaccine. Adults who wish to be protected from this disease, have certain high-risk conditions, work with hepatitis A-infected animals, work in hepatitis A research labs, or travel to or work in countries with a high rate of hepatitis A should be immunized. Adults who were previously unvaccinated and who anticipate close contact with an international adoptee during the first 60 days after arrival in the [place] from a country with a high rate of hepatitis A should be immunized.  Hepatitis B vaccine. Adults should be immunized if they wish to be protected from this disease, have certain high-risk conditions, may be exposed to blood or other infectious body fluids, are household contacts or sex partners of hepatitis B positive people, are clients or workers in certain care facilities, or travel to or work in countries with a high rate of hepatitis B.  Haemophilus influenzae type b (Hib) vaccine. A previously unvaccinated person with asplenia or sickle cell disease or having a scheduled splenectomy should receive 1 dose of Hib vaccine. Regardless of previous immunization, a recipient of a hematopoietic stem cell transplant should receive a 3-dose series 6-12 months after his successful transplant. Hib vaccine is not recommended for adults with HIV infection. Preventive Service / Frequency Ages  13 and over  Blood pressure check.** / Every 1 to 2 years.  Lipid and cholesterol check.**/ Every 5 years beginning at age 33.  Lung cancer screening. / Every year if you are aged 51-80 years  and have a 30-pack-year history of smoking and currently smoke or have quit within the past 15 years. Yearly screening is stopped once you have quit smoking for at least 15 years or develop a health problem that would prevent you from having lung cancer treatment.  Fecal occult blood test (FOBT) of stool. / Every year beginning at age 82 and continuing until age 21. You may not have to do this test if you get a colonoscopy every 10 years.  Flexible sigmoidoscopy** or colonoscopy.** / Every 5 years for a flexible sigmoidoscopy or every 10 years for a colonoscopy beginning at age 74 and continuing until age 71.  Hepatitis C blood test.** / For all people born from [birth year] through 1967 and any individual with known risks for hepatitis C.  Abdominal aortic aneurysm (AAA) screening for persons with Hypertension or who are current or former smokers.  Skin self-exam. / Monthly.  Influenza vaccine. / Every year.  Tetanus, diphtheria, and acellular pertussis (Tdap/Td) vaccine.** / 1 dose of Td every 10 years.  Varicella vaccine.** / Consult your health care provider.  Zoster vaccine.** / 1 dose for adults aged 67 years or older.  Pneumococcal 13-valent conjugate (PCV13) vaccine.** / Consult your health care provider.  Pneumococcal polysaccharide (PPSV23) vaccine.** / 1 dose for all adults aged 55 years and older.  Meningococcal vaccine.** / Consult your health care provider.  Hepatitis A vaccine.** / Consult your health care provider.  Hepatitis B vaccine.** / Consult your health care provider.  Haemophilus influenzae type b (Hib) vaccine.** / Consult your health care provider.

## 2014-01-03 NOTE — Progress Notes (Signed)
Patient ID: [name], male   DOB: 28-Jan-1941, 73 y.o.   MRN: [id number]   Annual Screening Comprehensive Examination  This very nice 73 y.o.DWM presents for complete physical.  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 Patient has stopped Nephrology f/u as he adamantly refuses to consider Dialysis.   HTN predates since 2008. Patient's BP has been controlled at home.Today's BP: 124/76 mmHg. Patient denies any cardiac symptoms as chest pain, palpitations, shortness of breath, dizziness or ankle swelling.   Last lipids were at goal with TC 182; HDL  28; LDL 80; Triglycerides 371 on 06/28/2013.   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 Patient averages Lantus 48 u/day with CBG's less than 150 and patient denies reactive hypoglycemic symptoms, visual blurring, diabetic polys or paresthesias. Last A1c was 6.9% on 06/28/2013.    Finally, patient has history of Vitamin D Deficiency of  43 in 2009   and last vitamin D was  78 on 06/28/2013. .  Medication Sig  . allopurinol (ZYLOPRIM) 300 MG tablet TAKE ONE TABLET BY MOUTH ONCE DAILY  . aspirin 81 MG tablet Take 81 mg by mouth daily.  . carvedilol (COREG) 12.5 MG tablet TAKE 1 TAB 2 x day  . cloNIDine  0.2 MG tab TAKE 1 Tab 2 to 3 x daily as needed for BP  . FLAX SEED OIL  Take by mouth.  . furosemide (LASIX) 40 MG tab TAKE 1 TAB Daily  . LANTUS 100 UNIT/ML inj Inject 48 Units into the skin daily.   . isosorbide-hydrALAZINE (BIDIL) 20-37.5 MG per tablet TAKE 1 TAB 3 x daily  . LANTUS SOLOSTAR 100 UNIT/ML Solostar Pen INJECT  50  TO  100 UNITS EVERY DAY AS DIRECTED  . losartan 100 MG tab TAKE 1 TAB daily for BP  . FISH OIL 1000 MG Take by mouth.  . vitamin B-12 1000 MCG tablet Take 1,000 mcg by mouth daily.   Allergies  Allergen Reactions  . Ace Inhibitors   . Citalopram   . Penicillins    Past Medical History  Diagnosis Date  . Hypertension   .  Hyperlipidemia   . Diabetes mellitus with renal complications   . CHF (congestive heart failure)   . DVT (deep venous thrombosis)     history  . Vitamin D deficiency   . Obesity   . Nephrolithiasis   . BPH (benign prostatic hyperplasia)   . Gout    Patients eye Dr Margaretmary Bayley last 1 yr ago and his last dental visit was 4  Yrs ago(dentures) Last DT 2010, Zostavax 12/2012 and he refuses pnvx and fluvax.  Past Surgical History  Procedure Laterality Date  . Eye surgery Bilateral     cataracts  . Ureteral stent placement Left 2008  . Cardiac catheterization  1999  . Thumb surgery  2007   Family History  Problem Relation Age of Onset  . Cancer Mother     breast  . Heart disease Mother   . Heart disease Father   . Diabetes Father   . Heart disease Sister   . Hypertension Brother    History   Social History  . Marital Status: Widowed    Spouse Name: N/A    Number of Children: N/A  . Years of Education: N/A    Social History Main Topics  . Smoking status: Former Smoker -- 2.00 packs/day for 56 years  Quit date: 03/29/2010  . Smokeless tobacco: Never Used  . Alcohol Use: Yes  . Drug Use: No  . Sexual Activity: Not on file    ROS Constitutional: Denies fever, chills, weight loss/gain, headaches, insomnia, fatigue, night sweats or change in appetite. Eyes: Denies redness, blurred vision, diplopia, discharge, itchy or watery eyes.  ENT: Denies discharge, congestion, post nasal drip, epistaxis, sore throat, earache, hearing loss, dental pain, Tinnitus, Vertigo, Sinus pain or snoring.  Cardio: Denies chest pain, palpitations, irregular heartbeat, syncope, dyspnea, diaphoresis, orthopnea, PND, claudication or edema Respiratory: denies cough, dyspnea, DOE, pleurisy, hoarseness, laryngitis or wheezing.  Gastrointestinal: Denies dysphagia, heartburn, reflux, water brash, pain, cramps, nausea, vomiting, bloating, diarrhea, constipation, hematemesis, melena, hematochezia, jaundice or  hemorrhoids Genitourinary: Denies dysuria, frequency, urgency, nocturia, hesitancy, discharge, hematuria or flank pain Musculoskeletal: Denies arthralgia, myalgia, stiffness, Jt. Swelling, pain, limp or strain/sprain. Denies Falls. Skin: Denies puritis, rash, hives, warts, acne, eczema or change in skin lesion Neuro: No weakness, tremor, incoordination, spasms, paresthesia or pain Psychiatric: Denies confusion, memory loss or sensory loss. Denies Depression. Endocrine: Denies change in weight, skin, hair change, nocturia, and paresthesia, diabetic polys, visual blurring or hyper / hypo glycemic episodes.  Heme/Lymph: No excessive bleeding, bruising or enlarged lymph nodes.  Physical Exam  BP 124/76  Pulse 76  Temp(Src) 97.9 F (36.6 C) (Temporal)  Resp 16  Ht  (1.702 m)  Wt 205 lb 9.6 oz (93.26 kg)  BMI 32.19 kg/m2  General Appearance: Well nourished, in no apparent distress. Eyes: PERRLA, EOMs, conjunctiva no swelling or erythema, normal fundi and vessels. Sinuses: No frontal/maxillary tenderness ENT/Mouth: EACs patent / TMs  nl. Nares clear without erythema, swelling, mucoid exudates. Oral hygiene is good. No erythema, swelling, or exudate. Tongue normal, non-obstructing. Tonsils not swollen or erythematous. Hearing normal.  Neck: Supple, thyroid normal. No bruits, nodes or JVD. Respiratory: Respiratory effort normal.  BS equal and clear bilateral without rales, rhonci, wheezing or stridor. Cardio: Heart sounds are normal with regular rate and rhythm and no murmurs, rubs or gallops. Peripheral pulses are normal and equal bilaterally without edema. No aortic or femoral bruits. Chest: symmetric with normal excursions and percussion.  Abdomen: Flat, soft, with bowl sounds. Nontender, no guarding, rebound, hernias, masses, or organomegaly.  Lymphatics: Non tender without lymphadenopathy.  Genitourinary: No hernias.Testes nl. DRE - prostate nl for age - smooth & firm w/o  nodules. Musculoskeletal: Full ROM all peripheral extremities, joint stability, 5/5 strength, and normal gait. Skin: Warm and dry without rashes, lesions, cyanosis, clubbing or  ecchymosis.  Neuro: Cranial nerves intact, reflexes equal bilaterally. Normal muscle tone, no cerebellar symptoms. Sensation intact.  Pysch: Awake and oriented X 3with normal affect, insight and judgment appropriate.  Assessment and Plan  1. Annual Screening Examination 2. Hypertension  3. Hyperlipidemia 4. 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 Vitamin D Deficiency  Continue prudent diet as discussed, weight control, BP monitoring, regular exercise, and medications as discussed.  Discussed med effects and SE's. Routine screening labs and tests as requested with regular follow-up as recommended.

## 2014-01-04 ENCOUNTER — Ambulatory Visit (INDEPENDENT_AMBULATORY_CARE_PROVIDER_SITE_OTHER): Payer: Commercial Managed Care - HMO | Admitting: Internal Medicine

## 2014-01-04 ENCOUNTER — Encounter: Payer: Self-pay | Admitting: Internal Medicine

## 2014-01-04 VITALS — BP 124/76 | HR 76 | Temp 97.9°F | Resp 16 | Ht 67.0 in | Wt 205.6 lb

## 2014-01-04 DIAGNOSIS — Z1212 Encounter for screening for malignant neoplasm of rectum: Secondary | ICD-10-CM

## 2014-01-04 DIAGNOSIS — Z789 Other specified health status: Secondary | ICD-10-CM

## 2014-01-04 DIAGNOSIS — Z79899 Other long term (current) drug therapy: Secondary | ICD-10-CM

## 2014-01-04 DIAGNOSIS — E1029 Type 1 diabetes mellitus with other diabetic kidney complication: Secondary | ICD-10-CM

## 2014-01-04 DIAGNOSIS — Z0000 Encounter for general adult medical examination without abnormal findings: Secondary | ICD-10-CM

## 2014-01-04 DIAGNOSIS — Z1331 Encounter for screening for depression: Secondary | ICD-10-CM

## 2014-01-04 DIAGNOSIS — Z125 Encounter for screening for malignant neoplasm of prostate: Secondary | ICD-10-CM

## 2014-01-04 DIAGNOSIS — E559 Vitamin D deficiency, unspecified: Secondary | ICD-10-CM

## 2014-01-04 DIAGNOSIS — E785 Hyperlipidemia, unspecified: Secondary | ICD-10-CM

## 2014-01-04 DIAGNOSIS — I10 Essential (primary) hypertension: Secondary | ICD-10-CM

## 2014-01-04 LAB — CBC WITH DIFFERENTIAL/PLATELET
BASOS PCT: 0 % (ref 0–1)
Basophils Absolute: 0 10*3/uL (ref 0.0–0.1)
EOS ABS: 0.3 10*3/uL (ref 0.0–0.7)
Eosinophils Relative: 3 % (ref 0–5)
HCT: 40.5 % (ref 39.0–52.0)
HEMOGLOBIN: 13.3 g/dL (ref 13.0–17.0)
Lymphocytes Relative: 20 % (ref 12–46)
Lymphs Abs: 1.7 10*3/uL (ref 0.7–4.0)
MCH: 31.4 pg (ref 26.0–34.0)
MCHC: 32.8 g/dL (ref 30.0–36.0)
MCV: 95.7 fL (ref 78.0–100.0)
MONOS PCT: 5 % (ref 3–12)
Monocytes Absolute: 0.4 10*3/uL (ref 0.1–1.0)
NEUTROS PCT: 72 % (ref 43–77)
Neutro Abs: 6.3 10*3/uL (ref 1.7–7.7)
PLATELETS: 167 10*3/uL (ref 150–400)
RBC: 4.23 MIL/uL (ref 4.22–5.81)
RDW: 15.4 % (ref 11.5–15.5)
WBC: 8.7 10*3/uL (ref 4.0–10.5)

## 2014-01-04 LAB — HEMOGLOBIN A1C
HEMOGLOBIN A1C: 6.4 % — AB (ref <5.7)
MEAN PLASMA GLUCOSE: 137 mg/dL — AB (ref <117)

## 2014-01-05 LAB — URIC ACID: Uric Acid, Serum: 4.3 mg/dL (ref 4.0–7.8)

## 2014-01-05 LAB — BASIC METABOLIC PANEL WITH GFR
BUN: 79 mg/dL — AB (ref 6–23)
CO2: 28 mEq/L (ref 19–32)
Calcium: 9 mg/dL (ref 8.4–10.5)
Chloride: 99 mEq/L (ref 96–112)
Creat: 6.61 mg/dL — ABNORMAL HIGH (ref 0.50–1.35)
GFR, EST AFRICAN AMERICAN: 9 mL/min — AB
GFR, Est Non African American: 8 mL/min — ABNORMAL LOW
Glucose, Bld: 187 mg/dL — ABNORMAL HIGH (ref 70–99)
Potassium: 4.9 mEq/L (ref 3.5–5.3)
SODIUM: 140 meq/L (ref 135–145)

## 2014-01-05 LAB — URINALYSIS, MICROSCOPIC ONLY
BACTERIA UA: NONE SEEN
Casts: NONE SEEN
Crystals: NONE SEEN
Squamous Epithelial / LPF: NONE SEEN

## 2014-01-05 LAB — MICROALBUMIN / CREATININE URINE RATIO
Creatinine, Urine: 107.2 mg/dL
MICROALB UR: 136.13 mg/dL — AB (ref 0.00–1.89)
MICROALB/CREAT RATIO: 1269.9 mg/g — AB (ref 0.0–30.0)

## 2014-01-05 LAB — HEPATIC FUNCTION PANEL
ALBUMIN: 3.6 g/dL (ref 3.5–5.2)
ALT: 9 U/L (ref 0–53)
AST: 14 U/L (ref 0–37)
Alkaline Phosphatase: 76 U/L (ref 39–117)
BILIRUBIN DIRECT: 0.1 mg/dL (ref 0.0–0.3)
Indirect Bilirubin: 0.3 mg/dL (ref 0.2–1.2)
TOTAL PROTEIN: 6 g/dL (ref 6.0–8.3)
Total Bilirubin: 0.4 mg/dL (ref 0.2–1.2)

## 2014-01-05 LAB — VITAMIN D 25 HYDROXY (VIT D DEFICIENCY, FRACTURES): VIT D 25 HYDROXY: 79 ng/mL (ref 30–89)

## 2014-01-05 LAB — LIPID PANEL
CHOLESTEROL: 151 mg/dL (ref 0–200)
HDL: 25 mg/dL — ABNORMAL LOW (ref >39)
LDL Cholesterol: 81 mg/dL (ref 0–99)
Total CHOL/HDL Ratio: 6 Ratio
Triglycerides: 223 mg/dL — ABNORMAL HIGH (ref <150)
VLDL: 45 mg/dL — ABNORMAL HIGH (ref 0–40)

## 2014-01-05 LAB — PSA: PSA: 3.97 ng/mL (ref <=4.00)

## 2014-01-05 LAB — MAGNESIUM: Magnesium: 2.8 mg/dL — ABNORMAL HIGH (ref 1.5–2.5)

## 2014-01-05 LAB — TSH: TSH: 1.593 u[IU]/mL (ref 0.350–4.500)

## 2014-01-05 LAB — INSULIN, FASTING: Insulin fasting, serum: 47.8 u[IU]/mL — ABNORMAL HIGH (ref 2.0–19.6)

## 2014-01-08 ENCOUNTER — Telehealth: Payer: Self-pay | Admitting: *Deleted

## 2014-01-08 NOTE — Telephone Encounter (Signed)
Pt aware of lab results.  1 month nurse visit scheduled to check BMET(401.9 and V58.69)

## 2014-01-08 NOTE — Telephone Encounter (Signed)
Message copied by Reggy Eye on Mon Jan 08, 2014 12:38 PM ------      Message from: Lucky Cowboy      Created: Sun Jan 07, 2014  1:47 PM       Labs ok  Except  A1c 6.4% - stricter diet -       - Kidney functions look a little worse this time       - Recc drink more water and  NV in 1 month - recheck BMET  Dx 401.9 / V58.69 ------

## 2014-01-16 ENCOUNTER — Other Ambulatory Visit (INDEPENDENT_AMBULATORY_CARE_PROVIDER_SITE_OTHER): Payer: Commercial Managed Care - HMO | Admitting: *Deleted

## 2014-01-16 DIAGNOSIS — Z1212 Encounter for screening for malignant neoplasm of rectum: Secondary | ICD-10-CM

## 2014-01-16 LAB — POC HEMOCCULT BLD/STL (HOME/3-CARD/SCREEN)
Card #2 Fecal Occult Blod, POC: NEGATIVE
Card #3 Fecal Occult Blood, POC: NEGATIVE
Fecal Occult Blood, POC: NEGATIVE

## 2014-02-08 ENCOUNTER — Ambulatory Visit: Payer: Self-pay

## 2014-04-05 ENCOUNTER — Other Ambulatory Visit: Payer: Self-pay | Admitting: *Deleted

## 2014-04-05 MED ORDER — ALLOPURINOL 300 MG PO TABS: 300.0000 mg | ORAL_TABLET | Freq: Every day | ORAL | Status: DC

## 2014-04-06 ENCOUNTER — Ambulatory Visit: Payer: Self-pay | Admitting: Podiatrist

## 2014-04-13 ENCOUNTER — Other Ambulatory Visit: Payer: Self-pay | Admitting: *Deleted

## 2014-04-13 ENCOUNTER — Other Ambulatory Visit: Payer: Self-pay | Admitting: Internal Medicine

## 2014-04-13 DIAGNOSIS — I5042 Chronic combined systolic (congestive) and diastolic (congestive) heart failure: Secondary | ICD-10-CM

## 2014-04-13 MED ORDER — ISOSORBIDE MONONITRATE ER 60 MG PO TB24
60.0000 mg | ORAL_TABLET | Freq: Every day | ORAL | Status: DC
Start: 2014-04-13 — End: 2014-04-17

## 2014-04-13 MED ORDER — HYDRALAZINE HCL 25 MG PO TABS: 25.0000 mg | ORAL_TABLET | Freq: Three times a day (TID) | ORAL | Status: DC

## 2014-04-17 ENCOUNTER — Other Ambulatory Visit: Payer: Self-pay | Admitting: Internal Medicine

## 2014-04-17 DIAGNOSIS — I5042 Chronic combined systolic (congestive) and diastolic (congestive) heart failure: Secondary | ICD-10-CM

## 2014-04-17 MED ORDER — ISOSORBIDE MONONITRATE ER 60 MG PO TB24: 60.0000 mg | ORAL_TABLET | Freq: Every day | ORAL | Status: DC

## 2014-04-17 MED ORDER — HYDRALAZINE HCL 25 MG PO TABS: 25.0000 mg | ORAL_TABLET | Freq: Three times a day (TID) | ORAL | Status: DC

## 2014-04-30 ENCOUNTER — Ambulatory Visit: Payer: Self-pay | Admitting: Physician Assistant

## 2014-04-30 ENCOUNTER — Other Ambulatory Visit: Payer: Self-pay | Admitting: *Deleted

## 2014-04-30 DIAGNOSIS — I5042 Chronic combined systolic (congestive) and diastolic (congestive) heart failure: Secondary | ICD-10-CM

## 2014-04-30 MED ORDER — ISOSORBIDE MONONITRATE ER 60 MG PO TB24: 60.0000 mg | ORAL_TABLET | Freq: Every day | ORAL | Status: DC

## 2014-04-30 MED ORDER — HYDRALAZINE HCL 25 MG PO TABS: 25.0000 mg | ORAL_TABLET | Freq: Three times a day (TID) | ORAL | Status: DC

## 2014-05-01 ENCOUNTER — Other Ambulatory Visit: Payer: Self-pay | Admitting: *Deleted

## 2014-05-01 DIAGNOSIS — I5042 Chronic combined systolic (congestive) and diastolic (congestive) heart failure: Secondary | ICD-10-CM

## 2014-05-01 MED ORDER — HYDRALAZINE HCL 25 MG PO TABS: 25.0000 mg | ORAL_TABLET | Freq: Three times a day (TID) | ORAL | Status: DC

## 2014-05-01 MED ORDER — ISOSORBIDE MONONITRATE ER 60 MG PO TB24: 60.0000 mg | ORAL_TABLET | Freq: Every day | ORAL | Status: DC

## 2014-05-12 ENCOUNTER — Inpatient Hospital Stay (HOSPITAL_COMMUNITY)
Admission: EM | Admit: 2014-05-12 | Discharge: 2014-05-28 | DRG: 682 | Disposition: E | Payer: Commercial Managed Care - HMO | Attending: Internal Medicine | Admitting: Internal Medicine

## 2014-05-12 ENCOUNTER — Encounter (HOSPITAL_COMMUNITY): Payer: Self-pay | Admitting: Vascular Surgery

## 2014-05-12 ENCOUNTER — Emergency Department (HOSPITAL_COMMUNITY): Payer: Commercial Managed Care - HMO

## 2014-05-12 DIAGNOSIS — Z87891 Personal history of nicotine dependence: Secondary | ICD-10-CM

## 2014-05-12 DIAGNOSIS — Z794 Long term (current) use of insulin: Secondary | ICD-10-CM | POA: Diagnosis not present

## 2014-05-12 DIAGNOSIS — E10649 Type 1 diabetes mellitus with hypoglycemia without coma: Secondary | ICD-10-CM | POA: Diagnosis present

## 2014-05-12 DIAGNOSIS — I120 Hypertensive chronic kidney disease with stage 5 chronic kidney disease or end stage renal disease: Principal | ICD-10-CM | POA: Diagnosis present

## 2014-05-12 DIAGNOSIS — N186 End stage renal disease: Secondary | ICD-10-CM | POA: Diagnosis present

## 2014-05-12 DIAGNOSIS — E669 Obesity, unspecified: Secondary | ICD-10-CM | POA: Diagnosis present

## 2014-05-12 DIAGNOSIS — N179 Acute kidney failure, unspecified: Secondary | ICD-10-CM | POA: Diagnosis present

## 2014-05-12 DIAGNOSIS — E875 Hyperkalemia: Secondary | ICD-10-CM | POA: Diagnosis present

## 2014-05-12 DIAGNOSIS — R34 Anuria and oliguria: Secondary | ICD-10-CM | POA: Diagnosis present

## 2014-05-12 DIAGNOSIS — N189 Chronic kidney disease, unspecified: Secondary | ICD-10-CM

## 2014-05-12 DIAGNOSIS — E785 Hyperlipidemia, unspecified: Secondary | ICD-10-CM | POA: Diagnosis present

## 2014-05-12 DIAGNOSIS — I509 Heart failure, unspecified: Secondary | ICD-10-CM | POA: Diagnosis present

## 2014-05-12 DIAGNOSIS — M109 Gout, unspecified: Secondary | ICD-10-CM | POA: Diagnosis present

## 2014-05-12 DIAGNOSIS — Z515 Encounter for palliative care: Secondary | ICD-10-CM | POA: Diagnosis not present

## 2014-05-12 DIAGNOSIS — E162 Hypoglycemia, unspecified: Secondary | ICD-10-CM

## 2014-05-12 DIAGNOSIS — E43 Unspecified severe protein-calorie malnutrition: Secondary | ICD-10-CM | POA: Diagnosis present

## 2014-05-12 DIAGNOSIS — E872 Acidosis: Secondary | ICD-10-CM | POA: Diagnosis present

## 2014-05-12 DIAGNOSIS — E1021 Type 1 diabetes mellitus with diabetic nephropathy: Secondary | ICD-10-CM | POA: Diagnosis present

## 2014-05-12 DIAGNOSIS — N400 Enlarged prostate without lower urinary tract symptoms: Secondary | ICD-10-CM | POA: Diagnosis present

## 2014-05-12 DIAGNOSIS — G9341 Metabolic encephalopathy: Secondary | ICD-10-CM

## 2014-05-12 DIAGNOSIS — Z6827 Body mass index (BMI) 27.0-27.9, adult: Secondary | ICD-10-CM

## 2014-05-12 DIAGNOSIS — E559 Vitamin D deficiency, unspecified: Secondary | ICD-10-CM | POA: Diagnosis present

## 2014-05-12 DIAGNOSIS — Z7982 Long term (current) use of aspirin: Secondary | ICD-10-CM | POA: Diagnosis not present

## 2014-05-12 DIAGNOSIS — G9349 Other encephalopathy: Secondary | ICD-10-CM | POA: Diagnosis present

## 2014-05-12 DIAGNOSIS — N185 Chronic kidney disease, stage 5: Secondary | ICD-10-CM

## 2014-05-12 DIAGNOSIS — Z66 Do not resuscitate: Secondary | ICD-10-CM | POA: Diagnosis present

## 2014-05-12 DIAGNOSIS — I10 Essential (primary) hypertension: Secondary | ICD-10-CM | POA: Diagnosis present

## 2014-05-12 DIAGNOSIS — G9340 Encephalopathy, unspecified: Secondary | ICD-10-CM

## 2014-05-12 DIAGNOSIS — Z86718 Personal history of other venous thrombosis and embolism: Secondary | ICD-10-CM

## 2014-05-12 DIAGNOSIS — E109 Type 1 diabetes mellitus without complications: Secondary | ICD-10-CM | POA: Diagnosis present

## 2014-05-12 DIAGNOSIS — N19 Unspecified kidney failure: Secondary | ICD-10-CM

## 2014-05-12 LAB — I-STAT CHEM 8, ED
CHLORIDE: 116 meq/L — AB (ref 96–112)
Calcium, Ion: 1.12 mmol/L — ABNORMAL LOW (ref 1.13–1.30)
Creatinine, Ser: 8.6 mg/dL — ABNORMAL HIGH (ref 0.50–1.35)
Glucose, Bld: 76 mg/dL (ref 70–99)
HEMATOCRIT: 41 % (ref 39.0–52.0)
HEMOGLOBIN: 13.9 g/dL (ref 13.0–17.0)
Potassium: 6.9 mmol/L (ref 3.5–5.1)
Sodium: 142 mmol/L (ref 135–145)
TCO2: 10 mmol/L (ref 0–100)

## 2014-05-12 LAB — COMPREHENSIVE METABOLIC PANEL
ALBUMIN: 3.2 g/dL — AB (ref 3.5–5.2)
ALK PHOS: 71 U/L (ref 39–117)
ALT: 21 U/L (ref 0–53)
ANION GAP: 17 — AB (ref 5–15)
AST: 71 U/L — AB (ref 0–37)
BILIRUBIN TOTAL: 0.9 mg/dL (ref 0.3–1.2)
BUN: 152 mg/dL — AB (ref 6–23)
CHLORIDE: 112 meq/L (ref 96–112)
CO2: 12 mmol/L — AB (ref 19–32)
Calcium: 8.3 mg/dL — ABNORMAL LOW (ref 8.4–10.5)
Creatinine, Ser: 8.74 mg/dL — ABNORMAL HIGH (ref 0.50–1.35)
GFR calc non Af Amer: 5 mL/min — ABNORMAL LOW (ref >90)
GFR, EST AFRICAN AMERICAN: 6 mL/min — AB (ref >90)
GLUCOSE: 77 mg/dL (ref 70–99)
POTASSIUM: 6.8 mmol/L — AB (ref 3.5–5.1)
SODIUM: 141 mmol/L (ref 135–145)
Total Protein: 6.3 g/dL (ref 6.0–8.3)

## 2014-05-12 LAB — CBC WITH DIFFERENTIAL/PLATELET
BASOS PCT: 0 % (ref 0–1)
Basophils Absolute: 0 10*3/uL (ref 0.0–0.1)
EOS ABS: 0 10*3/uL (ref 0.0–0.7)
EOS PCT: 0 % (ref 0–5)
HEMATOCRIT: 36.6 % — AB (ref 39.0–52.0)
HEMOGLOBIN: 12.2 g/dL — AB (ref 13.0–17.0)
Lymphocytes Relative: 5 % — ABNORMAL LOW (ref 12–46)
Lymphs Abs: 0.7 10*3/uL (ref 0.7–4.0)
MCH: 32.1 pg (ref 26.0–34.0)
MCHC: 33.3 g/dL (ref 30.0–36.0)
MCV: 96.3 fL (ref 78.0–100.0)
MONO ABS: 0.9 10*3/uL (ref 0.1–1.0)
Monocytes Relative: 7 % (ref 3–12)
Neutro Abs: 12 10*3/uL — ABNORMAL HIGH (ref 1.7–7.7)
Neutrophils Relative %: 88 % — ABNORMAL HIGH (ref 43–77)
PLATELETS: 143 10*3/uL — AB (ref 150–400)
RBC: 3.8 MIL/uL — AB (ref 4.22–5.81)
RDW: 14.7 % (ref 11.5–15.5)
WBC: 13.6 10*3/uL — AB (ref 4.0–10.5)

## 2014-05-12 LAB — CBG MONITORING, ED
GLUCOSE-CAPILLARY: 79 mg/dL (ref 70–99)
GLUCOSE-CAPILLARY: 51 mg/dL — AB (ref 70–99)
Glucose-Capillary: 91 mg/dL (ref 70–99)
Glucose-Capillary: 125 mg/dL — ABNORMAL HIGH (ref 70–99)

## 2014-05-12 LAB — I-STAT TROPONIN, ED: TROPONIN I, POC: 6.97 ng/mL — AB (ref 0.00–0.08)

## 2014-05-12 LAB — BRAIN NATRIURETIC PEPTIDE: B NATRIURETIC PEPTIDE 5: 1353 pg/mL — AB (ref 0.0–100.0)

## 2014-05-12 MED ORDER — SODIUM CHLORIDE 0.9 % IJ SOLN
3.0000 mL | Freq: Two times a day (BID) | INTRAMUSCULAR | Status: DC
  Administered 2014-05-12 – 2014-05-14 (×5): 3 mL via INTRAVENOUS

## 2014-05-12 MED ORDER — LORAZEPAM 2 MG/ML IJ SOLN
0.5000 mg | Freq: Once | INTRAMUSCULAR | Status: AC
  Administered 2014-05-12: 0.5 mg via INTRAVENOUS
  Filled 2014-05-12: qty 1

## 2014-05-12 MED ORDER — ONDANSETRON HCL 4 MG PO TABS: 4.0000 mg | ORAL_TABLET | Freq: Four times a day (QID) | ORAL | Status: DC | PRN

## 2014-05-12 MED ORDER — SODIUM CHLORIDE 0.9 % IJ SOLN: 3.0000 mL | INTRAMUSCULAR | Status: DC | PRN

## 2014-05-12 MED ORDER — DEXTROSE 50 % IV SOLN
1.0000 | Freq: Once | INTRAVENOUS | Status: AC
  Administered 2014-05-12: 50 mL via INTRAVENOUS
  Filled 2014-05-12: qty 50

## 2014-05-12 MED ORDER — SODIUM CHLORIDE 0.9 % IV SOLN: 250.0000 mL | INTRAVENOUS | Status: DC | PRN

## 2014-05-12 MED ORDER — MORPHINE SULFATE 2 MG/ML IJ SOLN
2.0000 mg | INTRAMUSCULAR | Status: DC | PRN
  Administered 2014-05-12: 2 mg via INTRAVENOUS
  Filled 2014-05-12: qty 1

## 2014-05-12 MED ORDER — ONDANSETRON HCL 4 MG/2ML IJ SOLN
4.0000 mg | Freq: Four times a day (QID) | INTRAMUSCULAR | Status: DC | PRN
  Administered 2014-05-12: 4 mg via INTRAVENOUS
  Filled 2014-05-12: qty 2

## 2014-05-12 MED ORDER — DEXTROSE 5 % IV SOLN
INTRAVENOUS | Status: DC
  Administered 2014-05-12: 21:00:00 via INTRAVENOUS
  Filled 2014-05-12 (×2): qty 1000

## 2014-05-12 MED ORDER — SODIUM CHLORIDE 0.9 % IV SOLN
1.0000 mg/h | INTRAVENOUS | Status: DC
  Administered 2014-05-12: 1 mg/h via INTRAVENOUS
  Filled 2014-05-12: qty 10

## 2014-05-12 NOTE — ED Notes (Signed)
Attempted report 

## 2014-05-12 NOTE — ED Notes (Signed)
Admitting PA at bedside.

## 2014-05-12 NOTE — ED Provider Notes (Signed)
CSN: [id number]     Arrival date & time 05/07/2014  1305 History   First MD Initiated Contact with Patient 02016/01/16 1324     Chief Complaint  Patient presents with  . Shortness of Breath  . Hypoglycemia     Patient is a 74 y.o. male presenting with shortness of breath and hypoglycemia. The history is provided by the patient and a relative. No language interpreter was used.  Shortness of Breath Hypoglycemia Associated symptoms: shortness of breath    [name] presents for evaluation of altered mental status.  Level V caveat due to altered mental status.  Patient presents by EMS for evaluation of confusion. He does not answer his phone last night family condition on him today and he was unresponsive. When they did check his sugar was found to be 21. Family reports that he's had a recent cold and cough. Patient reports that he's had vomiting and diarrhea recently. Family states that he has been adamant his medications but just started retaking them not long ago. Symptoms are moderate, constant.  Past Medical History  Diagnosis Date  . Hypertension   . Hyperlipidemia   . Diabetes mellitus with renal complications   . CHF (congestive heart failure)   . DVT (deep venous thrombosis)     history  . Vitamin D deficiency   . Obesity   . Nephrolithiasis   . BPH (benign prostatic hyperplasia)   . Gout    Past Surgical History  Procedure Laterality Date  . Eye surgery Bilateral     cataracts  . Ureteral stent placement Left 2008  . Cardiac catheterization  1999  . Thumb surgery  2007   Family History  Problem Relation Age of Onset  . Cancer Mother     breast  . Heart disease Mother   . Heart disease Father   . Diabetes Father   . Heart disease Sister   . Hypertension Brother    History  Substance Use Topics  . Smoking status: Former Smoker -- 2.00 packs/day for 56 years    Quit date: 03/29/2010  . Smokeless tobacco: Never Used  . Alcohol Use: Yes    Review of Systems   Unable to perform ROS Respiratory: Positive for shortness of breath.       Allergies  Ace inhibitors; Citalopram; and Penicillins  Home Medications   Prior to Admission medications   Medication Sig Start Date End Date Taking? Authorizing Provider  allopurinol (ZYLOPRIM) 300 MG tablet Take 1 tablet (300 mg total) by mouth daily. 04/05/14   Lucky CowboyWilliam McKeown, MD  aspirin 81 MG tablet Take 81 mg by mouth daily.    Historical Provider, MD  carvedilol (COREG) 12.5 MG tablet TAKE 1 TABLET TWICE DAILY FOR BLOOD PRESSURE AND HEART 11/20/13   Lucky CowboyWilliam McKeown, MD  Cholecalciferol (VITAMIN D PO) Take 5,000 Units by mouth daily.    Historical Provider, MD  cloNIDine (CATAPRES) 0.2 MG tablet TAKE 1 TABLET TWO OR THREE TIMES DAILY AS DIRECTED FOR BLOOD PRESSURE 11/20/13   Lucky CowboyWilliam McKeown, MD  Flaxseed, Linseed, (FLAX SEED OIL PO) Take by mouth.    Historical Provider, MD  furosemide (LASIX) 40 MG tablet TAKE 1 TABLET EVERY DAY FOR BLOOD PRESSURE  AND  FLUID 07/06/13   Melissa R Smith, PA-C  hydrALAZINE (APRESOLINE) 25 MG tablet Take 1 tablet (25 mg total) by mouth 3 (three) times daily. 05/01/14 05/01/15  Lucky CowboyWilliam McKeown, MD  isosorbide mononitrate (IMDUR) 60 MG 24 hr tablet Take 1 tablet (60  mg total) by mouth daily. 05/01/14 05/01/15  Lucky Cowboy, MD  LANTUS SOLOSTAR 100 UNIT/ML Solostar Pen INJECT  50  TO  100 UNITS EVERY DAY AS DIRECTED 11/20/13   Lucky Cowboy, MD  losartan (COZAAR) 100 MG tablet TAKE 1 TABLET EVERY DAY FOR BLOOD PRESSURE 07/06/13   Berenice Primas, PA-C  Omega-3 Fatty Acids (FISH OIL) 1000 MG CAPS Take by mouth.    Historical Provider, MD  vitamin B-12 (CYANOCOBALAMIN) 1000 MCG tablet Take 1,000 mcg by mouth daily.    Historical Provider, MD   BP 115/72 mmHg  Pulse 93  Temp(Src) 98.5 F (36.9 C) (Oral)  Resp 25  SpO2 93% Physical Exam  Constitutional: He appears well-developed and well-nourished.  HENT:  Head: Normocephalic and atraumatic.  Dry mucous membranes  Eyes: Pupils  are equal, round, and reactive to light.  Cardiovascular: Normal rate and regular rhythm.   No murmur heard. Pulmonary/Chest: No respiratory distress.  Diffuse rhonchi bilaterally  Abdominal: Soft. There is no tenderness. There is no rebound and no guarding.  Musculoskeletal: He exhibits no edema or tenderness.  Neurological: He is alert.  Confused with generalized weakness  Skin: Skin is warm and dry.  Psychiatric: He has a normal mood and affect. His behavior is normal.  Nursing note and vitals reviewed.   ED Course  Procedures (including critical care time) Labs Review Labs Reviewed  COMPREHENSIVE METABOLIC PANEL - Abnormal; Notable for the following:    Potassium 6.8 (*)    CO2 12 (*)    BUN 152 (*)    Creatinine, Ser 8.74 (*)    Calcium 8.3 (*)    Albumin 3.2 (*)    AST 71 (*)    GFR calc non Af Amer 5 (*)    GFR calc Af Amer 6 (*)    Anion gap 17 (*)    All other components within normal limits  BRAIN NATRIURETIC PEPTIDE - Abnormal; Notable for the following:    B Natriuretic Peptide 1353.0 (*)    All other components within normal limits  CBC WITH DIFFERENTIAL - Abnormal; Notable for the following:    WBC 13.6 (*)    RBC 3.80 (*)    Hemoglobin 12.2 (*)    HCT 36.6 (*)    Platelets 143 (*)    Neutrophils Relative % 88 (*)    Neutro Abs 12.0 (*)    Lymphocytes Relative 5 (*)    All other components within normal limits  I-STAT TROPOININ, ED - Abnormal; Notable for the following:    Troponin i, poc 6.97 (*)    All other components within normal limits  I-STAT CHEM 8, ED - Abnormal; Notable for the following:    Potassium 6.9 (*)    Chloride 116 (*)    BUN >140 (*)    Creatinine, Ser 8.60 (*)    Calcium, Ion 1.12 (*)    All other components within normal limits  CBG MONITORING, ED - Abnormal; Notable for the following:    Glucose-Capillary 51 (*)    All other components within normal limits  URINALYSIS, ROUTINE W REFLEX MICROSCOPIC  I-STAT CG4 LACTIC ACID,  ED  CBG MONITORING, ED  CBG MONITORING, ED    Imaging Review Ct Head Wo Contrast  05/13/2014   CLINICAL DATA:  Altered mental status.  Generalized weakness.  EXAM: CT HEAD WITHOUT CONTRAST  TECHNIQUE: Contiguous axial images were obtained from the base of the skull through the vertex without intravenous contrast.  COMPARISON:  12/28/2007.  FINDINGS: Diffusely enlarged ventricles and subarachnoid spaces. No intracranial hemorrhage, mass lesion or CT evidence of acute infarction. Unremarkable bones and included paranasal sinuses. Right vertebral and bilateral internal carotid artery atheromatous calcifications.  IMPRESSION: No acute abnormality. Mild to moderate atrophy with mild progression.   Electronically Signed   By: Gordan Payment M.D.   On: 05/27/2014 15:15   Dg Chest Port 1 View  05/23/2014   CLINICAL DATA:  Decrease responsiveness, hypoglycemia, lethargy, generalized weakness, cough with rhonchi, tachypnea, shortness of breath  EXAM: PORTABLE CHEST - 1 VIEW  COMPARISON:  Portable exam 1349 hr compared to in 12/17/2010  FINDINGS: Enlargement of cardiac silhouette.  Atherosclerotic calcification aorta.  Mediastinal contours and pulmonary vascularity normal.  Minimal bronchitic changes.  Lungs clear.  No pleural effusion or pneumothorax.  Bullet fragments project LEFT infraclavicular.  IMPRESSION: Enlargement of cardiac silhouette.  Minimal bronchitic changes without infiltrate.   Electronically Signed   By: Ulyses Southward M.D.   On: 04/28/2014 14:20     EKG Interpretation   Date/Time:  Saturday May 12 2014 14:19:09 EST Ventricular Rate:  92 PR Interval:  240 QRS Duration: 101 QT Interval:  390 QTC Calculation: 482 R Axis:   27 Text Interpretation:  Sinus rhythm Prolonged PR interval Low voltage,  precordial leads Nonspecific T abnormalities, lateral leads Borderline  prolonged QT interval Confirmed by Lincoln Brigham [phone]) on 05/18/2014 2:22:31  PM      MDM   Final diagnoses:  Acute  hyperkalemia  Renal failure (ARF), acute on chronic  Acute encephalopathy    Patient with history of chronic kidney disease here for episode of altered mental status with associated hypoglycemia. Labs are consistent with acute on chronic renal failure with significant electrolyte abnormalities. Discussed with patient and family treatment options given patient does not want dialysis, and family thinks that patient would want comfort care at this time. Discussed with medicine regarding admission for comfort measures. Patient did have recurrent worsening in his mental status during his ED stay, repeat blood sugar was with hypoglycemia and an additional dose of D50 was given.    Tilden Fossa, MD 05/07/2014 1705

## 2014-05-12 NOTE — Progress Notes (Signed)
Family concerned if patient is getting too much fluid at 50 ccs/hr. Family is also concerned that patient may need foley so that he will not get fluid overload. [facility] made aware. RN ordered to decrease fluids to 30 ccs/hr and to place a foley catheter. Nursing will continue to monitor

## 2014-05-12 NOTE — ED Notes (Signed)
[facility]. Family last talked to him yesterday morning and when the could not get a hold of him they called 911. Upon EMS arrival pt was minimally responsive. CBG 21 mg/dl and after 1 amp of V4050 he became more arousable and his CBG was then 109 mg/dl. Pt normally ambulatory and alert. At this time he is lethargic and has generalized weakness. Pt has a rhonchorous cough for an unknown amount of time. Denies any CP but is reporting some low back pain. Pt has dried yellow mucous noted to bilateral nasal passages. 12 lead shows NSR. Pt oriented to person, place, and situation. Disoriented to time. Resp slightly tachpnic at 21 bpm. Skin warm and dry.

## 2014-05-12 NOTE — ED Notes (Signed)
Pt placed on 2 L of O2 via nasal cannula for comfort and r/t increased O2 demands.

## 2014-05-12 NOTE — H&P (Signed)
[facility] History and Physical                                                                                    Patient Demographics  Scott KatzJerry Shaffer, is a 74 y.o. male  MRN: [id number]   DOB - 1940/11/30  Admit Date - 05/07/2014  Outpatient Primary MD for the patient is Scott CorwinMCKEOWN,WILLIAM DAVID, MD   With History of -  Past Medical History  Diagnosis Date  . Hypertension   . Hyperlipidemia   . Diabetes mellitus with renal complications   . CHF (congestive heart failure)   . DVT (deep venous thrombosis)     history  . Vitamin D deficiency   . Obesity   . Nephrolithiasis   . BPH (benign prostatic hyperplasia)   . Gout       Past Surgical History  Procedure Laterality Date  . Eye surgery Bilateral     cataracts  . Ureteral stent placement Left 2008  . Cardiac catheterization  1999  . Thumb surgery  2007    in for   Chief Complaint  Patient presents with  . Shortness of Breath  . Hypoglycemia     HPI  Scott KatzJerry Shaffer  is a 74 y.o. male, with a past medical history significant for hype 1 diabetes end stage V chronic kidney disease. [name] has made it very clear in the past that he does not want dialysis. He presents to the ER today after an episode of hypoglycemia (CBG of 27).  He lives at home alone. His family last spoke to him night before last (1/14) and he sounded normal. His family gives the report that he has had a rhonchorous cough for some period of time. In the ED he is found to be lethargic, with Kussmaul breathing, and complaining of lower back pain.  His creatinine is 8.6, CO2 is 12, potassium of 6.8. Anion gap of 17. [name] reiterates that he does not want dialysis and he is a DO NOT RESUSCITATE/DO NOT INTUBATE. His family understands that his kidneys are currently not functioning and that his prognosis is very poor. He will be admitted for comfort measures.    Review of Systems    Patient is unable to give a detailed review of systems due to  lethargy. He does complain of back pain.     Social History History  Substance Use Topics  . Smoking status: Former Smoker -- 2.00 packs/day for 56 years    Quit date: 03/29/2010  . Smokeless tobacco: Never Used  . Alcohol Use: Yes     Family History Family History  Problem Relation Age of Onset  . Cancer Mother     breast  . Heart disease Mother   . Heart disease Father   . Diabetes Father   . Heart disease Sister   . Hypertension Brother      Prior to Admission medications   Not on File    Allergies  Allergen Reactions  . Ace Inhibitors   . Citalopram   . Penicillins     Physical Exam  Vitals  Blood pressure 123/54, pulse 95, temperature 98.5 F (36.9 C), temperature  source Oral, resp. rate 20, SpO2 95 %.   General:  lying in bed in NAD, his body and speech are slightly tremorous, breathing rapidly. Family at bedside  Psych:  Lethargic, but able to answer some questions with slurred speech  ENT:  Eyes are closed, mucous membranes appear dry  Neck:  Supple Neck, No JVD, No cervical lymphadenopathy appreciated  Respiratory:  Shallow rapid breaths, no wheezes crackles or rales  Cardiac:  RRR, No Gallops, Rubs or Murmurs, No Parasternal Heave.  Abdomen: Abdomen Soft, Non tender, No organomegaly appreciated  Extremities:   joints appear normal , no effusions, Normal ROM.   Data Review  CBC  Recent Labs Lab 06/11/14 1349 June 11, 2014 1429  WBC 13.6*  --   HGB 12.2* 13.9  HCT 36.6* 41.0  PLT 143*  --   MCV 96.3  --   MCH 32.1  --   MCHC 33.3  --   RDW 14.7  --   LYMPHSABS 0.7  --   MONOABS 0.9  --   EOSABS 0.0  --   BASOSABS 0.0  --    ------------------------------------------------------------------------------------------------------------------  Chemistries   Recent Labs Lab 06/11/14 1349 06/11/2014 1429  NA 141 142  K 6.8* 6.9*  CL 112 116*  CO2 12*  --   GLUCOSE 77 76  BUN 152* >140*  CREATININE 8.74* 8.60*  CALCIUM 8.3*   --   AST 71*  --   ALT 21  --   ALKPHOS 71  --   BILITOT 0.9  --     ----------------------------------------------------------------------------------------------------------------  Imaging results:   Ct Head Wo Contrast  Jun 11, 2014   CLINICAL DATA:  Altered mental status.  Generalized weakness.  EXAM: CT HEAD WITHOUT CONTRAST  TECHNIQUE: Contiguous axial images were obtained from the base of the skull through the vertex without intravenous contrast.  COMPARISON:  12/28/2007.  FINDINGS: Diffusely enlarged ventricles and subarachnoid spaces. No intracranial hemorrhage, mass lesion or CT evidence of acute infarction. Unremarkable bones and included paranasal sinuses. Right vertebral and bilateral internal carotid artery atheromatous calcifications.  IMPRESSION: No acute abnormality. Mild to moderate atrophy with mild progression.   Electronically Signed   By: Gordan Payment M.D.   On: 2014/06/11 15:15   Dg Chest Port 1 View  06/11/2014   CLINICAL DATA:  Decrease responsiveness, hypoglycemia, lethargy, generalized weakness, cough with rhonchi, tachypnea, shortness of breath  EXAM: PORTABLE CHEST - 1 VIEW  COMPARISON:  Portable exam 1349 hr compared to in 12/17/2010  FINDINGS: Enlargement of cardiac silhouette.  Atherosclerotic calcification aorta.  Mediastinal contours and pulmonary vascularity normal.  Minimal bronchitic changes.  Lungs clear.  No pleural effusion or pneumothorax.  Bullet fragments project LEFT infraclavicular.  IMPRESSION: Enlargement of cardiac silhouette.  Minimal bronchitic changes without infiltrate.   Electronically Signed   By: Ulyses Southward M.D.   On: Jun 11, 2014 14:20    My personal review of EKG: Rhythm NSR    Assessment & Plan  Principal Problem:   Uremic encephalopathy Active Problems:   Hypertension   CHF (congestive heart failure)   Acute on chronic renal failure   Diabetes mellitus type 1   CKD (chronic kidney disease) stage 5, GFR less than 15  ml/min    Uremic encephalopathy Likely brought on by acute illness, hypoglycemia, poor by mouth intake Patient has been made comfort care. Will start slow morphine drip, when necessary IV Ativan, palliative medicine consult for possible GIP.    Metabolic acidosis in the setting of acute on chronic  kidney disease, stage V Secondary to renal failure and hypoglycemia. Patient has an anion gap of 17 Will start gentle IV fluids with bicarbonate just to see if the patient wakes up. Understanding that given his CHF and kidney failure he may easily fluid overload.    Type 1 diabetes Patient is Comfort Care. Insulin and CBGs have not been ordered  Hypertension At this point blood pressure is normal. Would recommend minimal vital sign checks. Comfort Care.  Acute on chronic Congestive heart failure Elevated BNP. Patient sounds wet on exam. Will pursue comfort measures only.  DVT Prophylaxis:  None.  Comfort care. AM Labs Ordered, also please review Full Orders  Family Communication:   Multiple family members at bedside including son and daughter   Code Status:  DO NOT RESUSCITATE   Condition:  Poor prognosis  Time spent in minutes : 60 min    York, Tora Kindred PA-C on May 16, 2014 at 6:12 PM  Between 7am to 7pm - Pager - [phone]  After 7pm go to [email or web address] - password [email or web address]  And look for the night coverage person covering me after hours  [facility] Office  [phone]

## 2014-05-12 NOTE — ED Notes (Signed)
Patient returned to room from CT. 

## 2014-05-13 LAB — BASIC METABOLIC PANEL
Anion gap: 13 (ref 5–15)
BUN: 161 mg/dL — AB (ref 6–23)
CALCIUM: 7.8 mg/dL — AB (ref 8.4–10.5)
CHLORIDE: 114 meq/L — AB (ref 96–112)
CO2: 14 mmol/L — AB (ref 19–32)
Creatinine, Ser: 8.94 mg/dL — ABNORMAL HIGH (ref 0.50–1.35)
GFR, EST AFRICAN AMERICAN: 6 mL/min — AB (ref >90)
GFR, EST NON AFRICAN AMERICAN: 5 mL/min — AB (ref >90)
GLUCOSE: 43 mg/dL — AB (ref 70–99)
Potassium: 5.9 mmol/L — ABNORMAL HIGH (ref 3.5–5.1)
Sodium: 141 mmol/L (ref 135–145)

## 2014-05-13 LAB — GLUCOSE, CAPILLARY
GLUCOSE-CAPILLARY: 111 mg/dL — AB (ref 70–99)
Glucose-Capillary: 108 mg/dL — ABNORMAL HIGH (ref 70–99)
Glucose-Capillary: 63 mg/dL — ABNORMAL LOW (ref 70–99)

## 2014-05-13 MED ORDER — DEXTROSE 50 % IV SOLN
INTRAVENOUS | Status: AC
  Administered 2014-05-13 (×2): 25 mL
  Filled 2014-05-13: qty 50

## 2014-05-13 NOTE — Progress Notes (Signed)
Hypoglycemic Event  CBG: 43  Treatment: 25 ml D50  Symptoms: None (Patient lethargic before incident)  Follow-up CBG: Time: 1140 CBG Result: 108  Possible Reasons for Event: Patient at end-of-life and not eating.  Comments/MD notified: Dr. Izola PriceMyers notified. New orders received.    [name], [name]  Remember to initiate Hypoglycemia Order Set & complete

## 2014-05-13 NOTE — Progress Notes (Signed)
Hypoglycemic Event  CBG: 63  Treatment: 25 mL D50  Symptoms: None  Follow-up CBG: Time: 1915 CBG Result: 111  Possible Reasons for Event: Not eating well  Comments/MD notified: No    [name], [name]  Remember to initiate Hypoglycemia Order Set & complete

## 2014-05-13 NOTE — Progress Notes (Addendum)
Patient ID: [name], male   DOB: [birth year], 74 y.o.   MRN: [id number]  [facility] PROGRESS NOTE  [name] WUJ:[id number] DOB: [birth year] DOA: 06-01-2014 PCP: Nadean Corwin, MD   Brief narrative:    74 y.o. male, with a past medical history significant for hype 1 diabetes, end stage V chronic kidney disease (does not want to have HD), presented to the ER after an episode of hypoglycemia (CBG of 27). He lives at home alone. His family last spoke to him night before last (1/14) and he sounded normal. His family gives the report that he has had a rhonchorous cough for some period of time. In the ED he is found to be lethargic, with Kussmaul breathing, and complaining of lower back pain. His creatinine was 8.6, CO2 is 12, potassium of 6.8. Anion gap of 17. Family confirms DNR status and explains pt very clear he wants no HD. PCT consulted.  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 Mild to moderate atrophy with mild progression.    Dg Chest Port 1 View  01-Jun-2014   Enlargement of cardiac silhouette.   Minimal bronchitic changes without infiltrate.  Medical Consultants:  PCT Other Consultants:  None  IAnti-Infectives:   None   Debbora Presto, MD  [facility] Pager [phone]  If 7PM-7AM, please contact night-coverage [email or web address] Password [email or web address] 05/13/2014, 2:56 PM   LOS: 1 day   HPI/Subjective: No events overnight.   Objective: Filed Vitals:   06/01/14 1745 06-01-14 1800 06-01-14 2006 05/13/14 0534  BP: 126/53 120/67 139/59 138/74  Pulse: 93 92 96 93  Temp:   99.6 F (37.6 C) 98.3 F (36.8 C)  TempSrc:   Oral Oral  Resp: SpO2: 92% 91% 97% 97%    Intake/Output Summary (Last 24 hours) at 05/13/14 1456 Last data filed at 05/13/14 0500  Gross per 24 hour  Intake    120 ml  Output    251 ml  Net   -131 ml    Exam:   General:  Pt is lethargic, NAD  Cardiovascular: Regular rate and rhythm, S1/S2, no murmurs, no rubs, no gallops  Respiratory:  rales at bases   Abdomen: Soft, non tender, non distended, bowel sounds present, no guarding  Data Reviewed: Basic Metabolic Panel:  Recent Labs Lab 2014-06-01 1349 2014/06/01 1429 05/13/14 1032  NA 141 142 141  K 6.8* 6.9* 5.9*  CL 112 116* 114*  CO2 12*  --  14*  GLUCOSE 77 76 43*  BUN 152* >140* 161*  CREATININE 8.74* 8.60* 8.94*  CALCIUM 8.3*  --  7.8*   Liver Function Tests:  Recent Labs Lab 12-04-2014 1349  AST 71*  ALT 21  ALKPHOS 71  BILITOT 0.9  PROT 6.3  ALBUMIN 3.2*   CBC:  Recent Labs Lab 12-04-2014 1349 12-04-2014 1429  WBC 13.6*  --   NEUTROABS 12.0*  --   HGB 12.2* 13.9  HCT 36.6* 41.0  MCV 96.3  --   PLT 143*  --    CBG:  Recent Labs Lab 12-04-2014 1327 12-04-2014 1348 12-04-2014 1614 12-04-2014 1707 05/13/14 1140  GLUCAP 91 79 51* 125* 108*    Scheduled Meds: . sodium chloride  3 mL Intravenous Q12H   Continuous Infusions: . dextrose 5 % 1,000 mL with sodium bicarbonate 100 mEq infusion 20 mL/hr at 12-04-2014 2206  . morphine 1 mg/hr (12-04-2014 2041)

## 2014-05-13 NOTE — Progress Notes (Signed)
Patient rested well throughout the night. RN offered morphine 2 mg IV push to the patient/patients family several times throughout the night for additional pain management/breakthrough relief. Patient/family only accepted morphine 2 mg IV push one time throughout the night. Patient received a full bed bath and full linen change from RN and NT. Patient was just repositioned by the RN and NT. Patients daughters by the bedside. Family thanked RN and NT for all of their help throughout the night. Nursing will continue to monitor and provide comfort care.

## 2014-05-14 DIAGNOSIS — Z515 Encounter for palliative care: Secondary | ICD-10-CM

## 2014-05-14 DIAGNOSIS — G9341 Metabolic encephalopathy: Secondary | ICD-10-CM

## 2014-05-14 MED ORDER — DIAZEPAM 5 MG/ML IJ SOLN: 5.0000 mg | INTRAMUSCULAR | Status: DC | PRN

## 2014-05-14 MED ORDER — MORPHINE BOLUS VIA INFUSION
2.0000 mg | INTRAVENOUS | Status: DC | PRN
  Filled 2014-05-14: qty 2

## 2014-05-14 MED ORDER — ARTIFICIAL TEARS OP OINT
TOPICAL_OINTMENT | OPHTHALMIC | Status: DC | PRN
  Filled 2014-05-14: qty 3.5

## 2014-05-14 MED ORDER — BISACODYL 10 MG RE SUPP: 10.0000 mg | Freq: Every day | RECTAL | Status: DC | PRN

## 2014-05-14 MED ORDER — SCOPOLAMINE 1 MG/3DAYS TD PT72
1.0000 | MEDICATED_PATCH | TRANSDERMAL | Status: DC
  Administered 2014-05-14: 1.5 mg via TRANSDERMAL
  Filled 2014-05-14: qty 1

## 2014-05-14 MED ORDER — SODIUM CHLORIDE 0.9 % IV SOLN
250.0000 mL | INTRAVENOUS | Status: DC | PRN
  Administered 2014-05-14: 250 mL via INTRAVENOUS

## 2014-05-14 NOTE — Progress Notes (Signed)
05/14/14 Contacted Dr. Izola PriceMyers regarding whether patient is appropriate for GIP. Dr. Izola PriceMyers states that the patient is appropriate for GIP. Spoke with the patient's daughters, son and brothers about GIP. [facility]Main contact would be daughter Gwendalyn EgeDonna Montomery (cell #[phone]). [facility] and notified her that patient will be GIP.Will continue to follow. Jacquelynn CreeMary Ezinne Yogi RN, BSN, CCM

## 2014-05-14 NOTE — Consult Note (Signed)
Palliative Medicine Team at [facility]  Date: 05/14/2014   Patient Name: [name]  DOB: 04/05/1941  MRN: [id number]  Age / Sex: 74 y.o., male   PCP: Lucky Cowboy, MD Referring Physician: Dorothea Ogle, MD  Active Problems: Principal Problem:   Uremic encephalopathy Active Problems:   Hypertension   CHF (congestive heart failure)   Acute on chronic renal failure   Diabetes mellitus type 1   CKD (chronic kidney disease) stage 5, GFR less than 15 ml/min   HPI/Reason for Consultation: Ebeling is a 74 y.o. male with ESRD, refusing HD. He is acidotic, hyperkalemic and now minimally responsive. Palliative consult for GIP hospice evaluation. Goals have already been established for comfort care. He has already been started on a morphine infusion.  Participants in Discussion: 2 daughters   Advance Directive: no   Code Status Orders        Start     Ordered   05/05/2014 1837  Do not attempt resuscitation (DNR)   Continuous    Question Answer Comment  In the event of cardiac or respiratory ARREST Do not call a "code blue"   In the event of cardiac or respiratory ARREST Do not perform Intubation, CPR, defibrillation or ACLS   In the event of cardiac or respiratory ARREST Use medication by any route, position, wound care, and other measures to relive pain and suffering. May use oxygen, suction and manual treatment of airway obstruction as needed for comfort.      04/30/2014 1836        I have reviewed the medical record, interviewed the patient and family, and examined the patient. The following aspects are pertinent.  Past Medical History  Diagnosis Date  . Hypertension   . Hyperlipidemia   . Diabetes mellitus with renal complications   . CHF (congestive heart failure)   . DVT (deep venous thrombosis)     history  . Vitamin D deficiency   . Obesity   . Nephrolithiasis   . BPH (benign prostatic hyperplasia)   . Gout    History   Social History  . Marital  Status: Widowed    Spouse Name: N/A    Number of Children: N/A  . Years of Education: N/A   Social History Main Topics  . Smoking status: Former Smoker -- 2.00 packs/day for 56 years    Quit date: 03/29/2010  . Smokeless tobacco: Never Used  . Alcohol Use: Yes  . Drug Use: No  . Sexual Activity: None   Other Topics Concern  . None   Social History Narrative   Family History  Problem Relation Age of Onset  . Cancer Mother     breast  . Heart disease Mother   . Heart disease Father   . Diabetes Father   . Heart disease Sister   . Hypertension Brother    Scheduled Meds: . sodium chloride  3 mL Intravenous Q12H   Continuous Infusions: . dextrose 5 % 1,000 mL with sodium bicarbonate 100 mEq infusion 20 mL/hr at 05/11/2014 2206  . morphine 1 mg/hr (05/08/2014 2041)   PRN Meds:.sodium chloride, morphine injection, ondansetron **OR** ondansetron (ZOFRAN) IV, sodium chloride Allergies  Allergen Reactions  . Ace Inhibitors   . Citalopram   . Penicillins    CBC:    Component Value Date/Time   WBC 13.6* 05/10/2014 1349   HGB 13.9 05/25/2014 1429   HCT 41.0 05/16/2014 1429   PLT 143* 04/27/2014 1349   MCV 96.3 05/18/2014  1349   NEUTROABS 12.0* May 09, 2014 1349   LYMPHSABS 0.7 May 09, 2014 1349   MONOABS 0.9 May 09, 2014 1349   EOSABS 0.0 May 09, 2014 1349   BASOSABS 0.0 May 09, 2014 1349   Comprehensive Metabolic Panel:    Component Value Date/Time   NA 141 05/13/2014 1032   K 5.9* 05/13/2014 1032   CL 114* 05/13/2014 1032   CO2 14* 05/13/2014 1032   BUN 161* 05/13/2014 1032   CREATININE 8.94* 05/13/2014 1032   CREATININE 6.61* 01/04/2014 1006   GLUCOSE 43* 05/13/2014 1032   CALCIUM 7.8* 05/13/2014 1032   CALCIUM 8.1* 03/13/2007 0730   AST 71* May 09, 2014 1349   ALT 21 May 09, 2014 1349   ALKPHOS 71 May 09, 2014 1349   BILITOT 0.9 May 09, 2014 1349   PROT 6.3 May 09, 2014 1349   ALBUMIN 3.2* May 09, 2014 1349    Vital Signs: BP 122/57 mmHg  Pulse 82  Temp(Src) 97.7 F (36.5  C) (Oral)  Resp 18  Ht 6' (1.829 m)  Wt 92.987 kg (205 lb)  BMI 27.80 kg/m2  SpO2 96% Filed Weights   05/13/14 1630  Weight: 92.987 kg (205 lb)   01/17 0701 - 01/18 0700 In: 1212 [P.O.:960; I.V.:252] Out: 550 [Urine:550]  Physical Exam:  Ruddy complexion, opens eye to voice, cannot speak, slightly labored +rhonchi Abdomen soft ++edema  Summary of Established Goals of Care and Medical Treatment Preferences  [name] is actively dying of ESRD-he has made a decision to not pursue hemodialysis and specifically requested comfort care. He was admitted on 1/16 with confusion and progressive weakness. Family on admission requested comfort and a recommendation was made for residential hospice facility referral. Today [facility]18 [facility] and this has already been discussed with the family. His vital signs are normal and he appears comfortable on a morphine infusion at 1mg /hr. No periods of apnea.  Primary Diagnoses  1. ESRD, no HD, comfort care.    Active Symptoms: 1. Dyspnea 2. Pain 3. Agitation/Confusion  Psycho-social/Spiritual:  2 supportive daughters at the bedside-they do not want any life prolonging interventions and full comfort  Prognosis: hours-days   Palliative Performance Scale: 10%  Recommendations: 1. DNR 2. Symptom Management:  He has already been started on Morphine infusion- seems to be handling this fine for now, but optimal choice of IV infusion medication for ESRD is dilaudid infusion to avoid neurotoxic side effects at EOL-short term Morphine is usually handled fine. I have educated RN on signs of toxicity including tremors, jerking or agitation. If these begin I would recommend changing to Dilaudid infusion.  Ativan and Haldol PRN for agitation  Secretion management  Oral Care  I discussed titration and bolusing of Morphine with RN.  I removed his oxygen for comfort-should titrate his morphine for comfort.  Hospice to  evaluate in AM . This patient has a high level of symptom management needs.    Time Total: 60 minutes Greater than 50%  of this time was spent counseling and coordinating care related to the above assessment and plan.  Signed by: Hilbert OdorGOLDING,ELIZABETH, DO  Elizabeth L Golding, DO  05/14/2014, 4:02 PM  Please contact Palliative Medicine Team phone at [phone] for questions and concerns.

## 2014-05-14 NOTE — Progress Notes (Signed)
Utilization review completed. Kemiah Booz, RN, BSN. 

## 2014-05-14 NOTE — Progress Notes (Signed)
Patient ID: QUENCY TOBER, male   DOB: 1940/07/25, 74 y.o.   MRN: [id number]  [facility] PROGRESS NOTE  VINCEN BEJAR WUJ:[id number] DOB: [birth year] DOA: 05/19/2014 PCP: Nadean Corwin, MD  Brief narrative:    74 y.o. male, with a past medical history significant for hype 1 diabetes, end stage V chronic kidney disease (does not want to have HD), presented to the ER after an episode of hypoglycemia (CBG of 27). He lives at home alone. His family last spoke to him night before last (1/14) and he sounded normal. His family gives the report that he has had a rhonchorous cough for some period of time. In the ED he is found to be lethargic, with Kussmaul breathing, and complaining of lower back pain. His creatinine was 8.6, CO2 is 12, potassium of 6.8. Anion gap of 17. Family confirms DNR status and explains pt very clear he wants no HD. PCT consulted.  Assessment/Plan:    Principal Problem:  Uremic encephalopathy - still lethargic, continue to titrate morphine drip to ensure comfort - suction if needed to help with respiratory comfort  - family again confirmed DNR status - PCT consulted for further assistance  Active Problems:  Hypertension - reasonable control for now   Metabolic acidosis - from uremia - continue to ensure comfort for the pt  - no further blood work   CHF (congestive heart failure), diastolic - in the setting of ESRD, no HD wanted per pt - will respect wishes   ESRD - PCT consulted  - no further blood test to ensure comfort   Diabetes mellitus type 1 - hypoglycemic events - comfort feeding if pt able to tolerate   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 Mild to moderate atrophy with mild progression.  Dg Chest Port 1 View 05/08/2014 Enlargement of cardiac silhouette. Minimal bronchitic changes without infiltrate.  Medical Consultants:  PCT Other Consultants:  None  IAnti-Infectives:   None   Debbora Presto, MD  [facility] Pager [phone]  If 7PM-7AM, please contact night-coverage [email or web address] Password [facility] 05/14/2014, 9:54 AM   LOS: 2 days   HPI/Subjective: No events overnight.   Objective: Filed Vitals:   05/13/14 1556 05/13/14 1630 05/13/14 1954 05/14/14 0540  BP: 138/70  111/56 122/57  Pulse: 89  78 82  Temp: 98.2 F (36.8 C)  98.2 F (36.8 C) 97.7 F (36.5 C)  TempSrc: Oral  Oral Oral  Resp: Height:  6' (1.829 m)    Weight:  92.987 kg (205 lb)    SpO2: 98%  98% 96%    Intake/Output Summary (Last 24 hours) at 05/14/14 0954 Last data filed at 05/14/14 0500  Gross per 24 hour  Intake    732 ml  Output    550 ml  Net    182 ml    Exam:   General:  Pt is lethargic, in mild distress due to shortness of breath   Cardiovascular: Regular rate and rhythm, no rubs, no gallops  Respiratory: Rales bilaterally with gargling sounds   Abdomen: Soft, non tender, non distended, bowel sounds present, no guarding  Data Reviewed: Basic  Metabolic Panel:  Recent Labs Lab 05/08/2014 1349 05/23/2014 1429 05/13/14 1032  NA 141 142 141  K 6.8* 6.9* 5.9*  CL 112 116* 114*  CO2 12*  --  14*  GLUCOSE 77 76 43*  BUN 152* >140* 161*  CREATININE 8.74* 8.60* 8.94*  CALCIUM 8.3*  --  7.8*   Liver Function Tests:  Recent Labs Lab 05/11/2014 1349  AST 71*  ALT 21  ALKPHOS 71  BILITOT 0.9  PROT 6.3  ALBUMIN 3.2*   CBC:  Recent Labs Lab 05/22/2014 1349 05/10/2014 1429  WBC 13.6*  --   NEUTROABS 12.0*  --   HGB 12.2* 13.9  HCT 36.6* 41.0  MCV 96.3  --   PLT 143*  --    CBG:  Recent Labs Lab  05/05/2014 1614 05/13/2014 1707 05/13/14 1140 05/13/14 1844 05/13/14 1917  GLUCAP 51* 125* 108* 63* 111*   Scheduled Meds: . sodium chloride  3 mL Intravenous Q12H   Continuous Infusions: . dextrose 5 % 1,000 mL with sodium bicarbonate 100 mEq infusion 20 mL/hr at 05/27/2014 2206  . morphine 1 mg/hr (05/20/2014 2041)

## 2014-05-21 ENCOUNTER — Ambulatory Visit: Payer: Self-pay | Admitting: Physician Assistant

## 2014-05-28 NOTE — Discharge Summary (Signed)
  Death Summary  Dareen PianoJerry A Sweatt ZOX:[id number] DOB: 12/21/1940 DOA: 06-25-2014  PCP: Nadean CorwinMCKEOWN,WILLIAM DAVID, MD PCP/Office notified:   Admit date: 06-25-2014 Date of Death: 04/29/2014 6:04 am Maren Reamer( Karen Kirby certified )  Brief narrative:    74 y.o. male, with a past medical history significant for hype 1 diabetes, end stage V chronic kidney disease (does not want to have HD), presented to the ER after an episode of hypoglycemia (CBG of 27). He lives at home alone. His family last spoke to him night before last (1/14) and he sounded normal. His family gives the report that he has had a rhonchorous cough for some period of time. In the ED he is found to be lethargic, with Kussmaul breathing, and complaining of lower back pain. His creatinine was 8.6, CO2 is 12, potassium of 6.8. Anion gap of 17. Family confirms DNR status and explains pt very clear he wants no HD. PCT consulted.  Assessment/Plan:    Principal Problem:  Uremic encephalopathy - pt has remained lethargic, comfort care desired per pt and family - suction if needed to help with respiratory comfort provided  - PCT consulted for further assistance  - pt passed away at 6:04 am and NP on call notified Maren ReamerKaren Kirby  Active Problems:  Hypertension - remained stable initially   Metabolic acidosis - from uremia - continued to ensure comfort for the pt  - no further blood work done to ensure comfort   CHF (congestive heart failure), diastolic - in the setting of ESRD, no HD wanted per pt  ESRD - PCT consulted  - no further blood test to ensure comfort   Diabetes mellitus type 1 - hypoglycemic events as pt not able to take anything PO   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 Mild to moderate atrophy with mild progression.  Dg Chest Port 1 View 06-25-2014  Enlargement of cardiac silhouette. Minimal bronchitic changes without infiltrate.  Medical Consultants:  PCT Signed:  Debbora PrestoMAGICK-Izan Miron  [facility] 05/08/2014, 9:13 AM  Pager [phone]  If 7PM-7AM, please contact night-coverage [email or web address] Password [email or web address]

## 2014-05-28 NOTE — Progress Notes (Addendum)
Pt time of death 0442 January 19th, 2016. On call MD notified. Cheron SchaumannKaren Kirby-Graham,NP certified death.  Family at bedside presently. Confirmed by Ronaldo MiyamotoJustin Eun Vermeer RN and Dorann LodgeHui Peng, RN. Donor Services notified. Chaplain paged. Waiting for additional family members to arrive.

## 2014-05-28 NOTE — Progress Notes (Signed)
195 ml of Morphine drip bag wasted in sink after patient expiration. Witness Abagail Kitchenshristine Fanjoy RN.

## 2014-05-28 DEATH — deceased

## 2014-08-02 ENCOUNTER — Ambulatory Visit: Payer: Self-pay | Admitting: Internal Medicine

## 2015-01-08 ENCOUNTER — Encounter: Payer: Self-pay | Admitting: Internal Medicine
